# Patient Record
Sex: Male | Born: 1980 | Hispanic: Yes | Marital: Married | Smoking: Never smoker
Health system: Southern US, Community
[De-identification: ages and names within clinical notes are randomized; demographics above are authoritative.]

## PROBLEM LIST (undated history)

## (undated) DIAGNOSIS — G43909 Migraine, unspecified, not intractable, without status migrainosus: Secondary | ICD-10-CM

## (undated) DIAGNOSIS — K449 Diaphragmatic hernia without obstruction or gangrene: Secondary | ICD-10-CM

## (undated) DIAGNOSIS — B019 Varicella without complication: Secondary | ICD-10-CM

## (undated) DIAGNOSIS — K200 Eosinophilic esophagitis: Secondary | ICD-10-CM

## (undated) DIAGNOSIS — Z973 Presence of spectacles and contact lenses: Secondary | ICD-10-CM

## (undated) DIAGNOSIS — K810 Acute cholecystitis: Secondary | ICD-10-CM

## (undated) HISTORY — DX: Acute cholecystitis: K81.0

## (undated) HISTORY — PX: INGUINAL HERNIA REPAIR: SUR1180

## (undated) HISTORY — DX: Migraine, unspecified, not intractable, without status migrainosus: G43.909

## (undated) HISTORY — DX: Varicella without complication: B01.9

---

## 2003-08-08 DIAGNOSIS — S3600XA Unspecified injury of spleen, initial encounter: Secondary | ICD-10-CM

## 2003-08-08 DIAGNOSIS — Z87828 Personal history of other (healed) physical injury and trauma: Secondary | ICD-10-CM

## 2003-08-08 HISTORY — DX: Unspecified injury of spleen, initial encounter: S36.00XA

## 2003-08-08 HISTORY — DX: Personal history of other (healed) physical injury and trauma: Z87.828

## 2019-06-08 DIAGNOSIS — K8020 Calculus of gallbladder without cholecystitis without obstruction: Secondary | ICD-10-CM

## 2019-06-08 HISTORY — DX: Calculus of gallbladder without cholecystitis without obstruction: K80.20

## 2019-06-21 HISTORY — PX: LAPAROSCOPIC CHOLECYSTECTOMY: SUR755

## 2019-06-21 HISTORY — PX: CHOLECYSTECTOMY: SHX55

## 2019-06-21 LAB — BASIC METABOLIC PANEL
BUN: 14 (ref 4–21)
Creatinine: 1 (ref 0.6–1.3)
Glucose: 119

## 2019-06-21 LAB — COMPREHENSIVE METABOLIC PANEL
Albumin: 4.6 (ref 3.5–5.0)
Calcium: 9.5 (ref 8.7–10.7)

## 2019-06-21 LAB — NOVEL CORONAVIRUS, NAA: SARS-CoV-2, NAA: NEGATIVE

## 2019-06-21 LAB — CBC AND DIFFERENTIAL
HCT: 46 (ref 41–53)
Hemoglobin: 14.5 (ref 13.5–17.5)
Platelets: 296 (ref 150–399)
WBC: 12.8

## 2019-06-21 LAB — HEPATIC FUNCTION PANEL
Alkaline Phosphatase: 69 (ref 25–125)
Bilirubin, Total: 0.4

## 2019-06-21 LAB — CBC: RBC: 5.67 — AB (ref 3.87–5.11)

## 2019-06-26 ENCOUNTER — Encounter: Payer: Self-pay | Admitting: Family Medicine

## 2019-07-01 ENCOUNTER — Other Ambulatory Visit: Payer: Self-pay

## 2019-07-01 ENCOUNTER — Encounter: Payer: Self-pay | Admitting: Family Medicine

## 2019-07-01 ENCOUNTER — Ambulatory Visit: Payer: BC Managed Care – PPO | Admitting: Family Medicine

## 2019-07-01 VITALS — BP 140/85 | HR 78 | Temp 97.8°F | Resp 18 | Ht 68.0 in | Wt 202.0 lb

## 2019-07-01 DIAGNOSIS — K8020 Calculus of gallbladder without cholecystitis without obstruction: Secondary | ICD-10-CM | POA: Diagnosis not present

## 2019-07-01 DIAGNOSIS — E669 Obesity, unspecified: Secondary | ICD-10-CM | POA: Insufficient documentation

## 2019-07-01 DIAGNOSIS — Z9049 Acquired absence of other specified parts of digestive tract: Secondary | ICD-10-CM | POA: Diagnosis not present

## 2019-07-01 DIAGNOSIS — R030 Elevated blood-pressure reading, without diagnosis of hypertension: Secondary | ICD-10-CM | POA: Diagnosis not present

## 2019-07-01 DIAGNOSIS — Z0000 Encounter for general adult medical examination without abnormal findings: Secondary | ICD-10-CM

## 2019-07-01 NOTE — Progress Notes (Signed)
Patient ID: Adam Dean, male  DOB: 1980/12/08, 38 y.o.   MRN: [id number] Patient Care Team    Relationship Specialty Notifications Start End  Ma Hillock, DO PCP - General Family Medicine  06/26/19     Chief Complaint  Patient presents with  . Establish Care    Pt had gall bladder removed two weeks ago when in [place]. He has not had a F/U appt for this. No prior PCP.     Subjective:  Adam Dean is a 38 y.o.  male present for new patient establishment. All past medical history, surgical history, allergies, family history, immunizations, medications and social history were updated in the electronic medical record today. All recent labs, ED visits and hospitalizations within the last year were reviewed.  S/p cholecystectomy: Pt reports he was in [place] for a funeral and started experiencing severe abdominal pain 06/21/2019.  He went to the emergency room was found to have a large gallstone and went under emergent surgery for cholecystectomy.  He endorses having similar pain in October that lasted about 5 to 6 hours.  He reports he is recuperating well since surgery.  He is eating and drinking well without difficulty.  He used provided pain medication for about 3 days, and has not needed it since.  He is having normal bowel movements.  No problems urinating.  He denies fever, chills, nausea, vomit, diarrhea, abdominal pain, cough or leg pain since surgery.  He did drive home from [place] after surgery.  Depression screen [facility] 2/9 07/01/2019  Decreased Interest 0  Down, Depressed, Hopeless 0  PHQ - 2 Score 0   No flowsheet data found.     No flowsheet data found.  Immunization History  Administered Date(s) Administered  . IPV 11/18/1981, 02/03/1982, 04/07/1982  . MMR 01/12/1983, 06/16/1994  . Td 04/13/1993, 08/21/2012  . Tdap 08/21/2012    No exam data present  Past Medical History:  Diagnosis Date  . Acute cholecystitis   . Chicken pox   . Gallstones 06/2019   Emergent cholecystectomy in [place].  . Migraine    Allergies  Allergen Reactions  . Banana Diarrhea and Nausea And Vomiting   Past Surgical History:  Procedure Laterality Date  . CHOLECYSTECTOMY  06/21/2019   Family History  Problem Relation Age of Onset  . Hypertension Mother   . Birth defects Sister   . Heart attack Maternal Grandmother   . Heart attack Maternal Grandfather    Social History   Social History Narrative   Marital status/children/pets: Married.   Education/employment: Bachelor's degree.   Safety:      -smoke alarm in the home:Yes     - wears seatbelt: Yes     - Feels safe in their relationships: Yes    Allergies as of 07/01/2019      Reactions   Banana Diarrhea, Nausea And Vomiting      Medication List    as of July 01, 2019 12:13 PM   You have not been prescribed any medications.     All past medical history, surgical history, allergies, family history, immunizations andmedications were updated in the EMR today and reviewed under the history and medication portions of their EMR.    Recent Results (from the past 2160 hour(s))  CBC and differential     Status: None   Collection Time: 06/21/19 12:00 AM  Result Value Ref Range   Hemoglobin 14.5 13.5 - 17.5   HCT 46 41 - 53  Platelets 296 150 - 399   WBC 12.8   CBC     Status: Abnormal   Collection Time: 06/21/19 12:00 AM  Result Value Ref Range   RBC 5.67 (A) 3.87 - 5.11  Novel Coronavirus, NAA ([facility])     Status: None   Collection Time: 06/21/19 12:00 AM   Specimen: Nasopharyngeal(NP) swabs in vial transport medium  Result Value Ref Range   SARS-CoV-2, NAA Negative   Basic metabolic panel     Status: None   Collection Time: 06/21/19 12:00 AM  Result Value Ref Range   Glucose 119    BUN 14 4 - 21   Creatinine 1.0 0.6 - 1.3  Comprehensive metabolic panel     Status: None   Collection Time: 06/21/19 12:00 AM  Result Value Ref Range   Calcium 9.5 8.7 - 10.7   Albumin 4.6 3.5 -  5.0  Hepatic function panel     Status: None   Collection Time: 06/21/19 12:00 AM  Result Value Ref Range   Alkaline Phosphatase 69 25 - 125   Bilirubin, Total 0.4     Patient was never admitted.   ROS: 14 pt review of systems performed and negative (unless mentioned in an HPI)  Objective: BP 140/85 (BP Location: Left Arm, Patient Position: Sitting, Cuff Size: Normal)   Pulse 78   Temp 97.8 F (36.6 C) (Temporal)   Resp 18   Ht _0  (1.727 m)   Wt 202 lb (91.6 kg)   SpO2 98%   BMI 30.71 kg/m  Gen: Afebrile. No acute distress. Nontoxic in appearance, well-developed, well-nourished, pleasant male. HENT: AT. [place].  No cough on exam, no hoarseness on exam. Eyes:Pupils Equal Round Reactive to light, Extraocular movements intact,  Conjunctiva without redness, discharge or icterus. CV: RRR no murmur, no edema Chest: CTAB, no wheeze, rhonchi or crackles.  Abd: Soft.  Flat.  Well-healing and approximated port sites, epigastric incision and umbilicus incision-no erythema, no drainage.  NTND. BS present.  No masses palpated. No hepatosplenomegaly. No rebound tenderness or guarding. Skin: No rashes, purpura or petechiae. Warm and well-perfused. Skin intact. Neuro/Msk: Normal gait. PERLA. EOMi. Alert. Oriented x3.  Negative Homans bilaterally. Psych: Normal affect, dress and demeanor. Normal speech. Normal thought content and judgment.   Assessment/plan: Adam Dean is a 38 y.o. male present for est care Status post cholecystectomy/Gallstones VSS.  Patient appears well today.  Tolerating p.o. and no problems with bowel movements or voiding. Incision sites are healing well with good approximation. Encouraged him to follow-up if he develops a fever, cough, leg pain, shortness of breath or abdominal pain.  Elevated blood pressure without diagnosis of hypertension: Reviewed EMR and blood pressures routinely have been normal.  Advised him to monitor his blood pressures when able.  We will  schedule him for his physical in 2-3 months and recheck at that time.  Return in about 3 months (around 10/01/2019) for CPE (30 min).  This visit occurred during the SARS-CoV-2 public health emergency.  Safety protocols were in place, including screening questions prior to the visit, additional usage of staff PPE, and extensive cleaning of exam room while observing appropriate contact time as indicated for disinfecting solutions.   Greater than 30 minutes spent with patient, >50% of time spent face to face    Note is dictated utilizing voice recognition software. Although note has been proof read prior to signing, occasional typographical errors still can be missed. If any questions arise, please do not hesitate  to call for verification.  Electronically signed by: Howard Pouch, DO [place]

## 2019-07-01 NOTE — Patient Instructions (Addendum)
Nice to meet you today.  You look like you are healing well.  We will call you to schedule a physical in 2-3 months. Try to be fasting for 6 hours- water only.      Please help [place] help you:  We are honored you have chosen [facility] for your Primary Care home. Below you will find basic instructions that you may need to access in the future. Please help [place] help you by reading the instructions, which cover many of the frequent questions we experience.   Prescription refills and request:  -In order to allow more efficient response time, please call your pharmacy for all refills. They will forward the request electronically to [place]. This allows for the quickest possible response. Request left on a nurse line can take longer to refill, since these are checked as time allows between office patients and other phone calls.  - refill request can take up to 3-5 working days to complete.  - If request is sent electronically and request is appropiate, it is usually completed in 1-2 business days.  - all patients will need to be seen routinely for all chronic medical conditions requiring prescription medications (see follow-up below). If you are overdue for follow up on your condition, you will be asked to make an appointment and we will call in enough medication to cover you until your appointment (up to 30 days).  - all controlled substances will require a face to face visit to request/refill.  - if you desire your prescriptions to go through a new pharmacy, and have an active script at original pharmacy, you will need to call your pharmacy and have scripts transferred to new pharmacy. This is completed between the pharmacy locations and not by your provider.    Results: If any images or labs were ordered, it can take up to 1 week to get results depending on the test ordered and the lab/facility running and resulting the test. - Normal or stable results, which do not need further discussion, may be  released to your mychart immediately with attached note to you. A call may not be generated for normal results. Please make certain to sign up for mychart. If you have questions on how to activate your mychart you can call the front office.  - If your results need further discussion, our office will attempt to contact you via phone, and if unable to reach you after 2 attempts, we will release your abnormal result to your mychart with instructions.  - All results will be automatically released in mychart after 1 week.  - Your provider will provide you with explanation and instruction on all relevant material in your results. Please keep in mind, results and labs may appear confusing or abnormal to the untrained eye, but it does not mean they are actually abnormal for you personally. If you have any questions about your results that are not covered, or you desire more detailed explanation than what was provided, you should make an appointment with your provider to do so.   Our office handles many outgoing and incoming calls daily. If we have not contacted you within 1 week about your results, please check your mychart to see if there is a message first and if not, then contact our office.  In helping with this matter, you help decrease call volume, and therefore allow [place] to be able to respond to patients needs more efficiently.   Acute office visits (sick visit):  An acute  visit is intended for a new problem and are scheduled in shorter time slots to allow schedule openings for patients with new problems. This is the appropriate visit to discuss a new problem. Problems will not be addressed by phone call or Echart message. Appointment is needed if requesting treatment. In order to provide you with excellent quality medical care with proper time for you to explain your problem, have an exam and receive treatment with instructions, these appointments should be limited to one new problem per visit. If you  experience a new problem, in which you desire to be addressed, please make an acute office visit, we save openings on the schedule to accommodate you. Please do not save your new problem for any other type of visit, let us take care of it properly and quickly for you.   Follow up visits:  Depending on your condition(s) your provider will need to see you routinely in order to provide you with quality care and prescribe medication(s). Most chronic conditions (Example: hypertension, Diabetes, depression/anxiety... etc), require visits a couple times a year. Your provider will instruct you on proper follow up for your personal medical conditions and history. Please make certain to make follow up appointments for your condition as instructed. Failing to do so could result in lapse in your medication treatment/refills. If you request a refill, and are overdue to be seen on a condition, we will always provide you with a 30 day script (once) to allow you time to schedule.    Medicare wellness (well visit): - we have a wonderful Nurse Maudie Mercury), that will meet with you and provide you will yearly medicare wellness visits. These visits should occur yearly (can not be scheduled less than 1 calendar year apart) and cover preventive health, immunizations, advance directives and screenings you are entitled to yearly through your medicare benefits. Do not miss out on your entitled benefits, this is when medicare will pay for these benefits to be ordered for you.  These are strongly encouraged by your provider and is the appropriate type of visit to make certain you are up to date with all preventive health benefits. If you have not had your medicare wellness exam in the last 12 months, please make certain to schedule one by calling the office and schedule your medicare wellness with Maudie Mercury as soon as possible.   Yearly physical (well visit):  - Adults are recommended to be seen yearly for physicals. Check with your insurance  and date of your last physical, most insurances require one calendar year between physicals. Physicals include all preventive health topics, screenings, medical exam and labs that are appropriate for gender/age and history. You may have fasting labs needed at this visit. This is a well visit (not a sick visit), new problems should not be covered during this visit (see acute visit).  - Pediatric patients are seen more frequently when they are younger. Your provider will advise you on well child visit timing that is appropriate for your their age. - This is not a medicare wellness visit. Medicare wellness exams do not have an exam portion to the visit. Some medicare companies allow for a physical, some do not allow a yearly physical. If your medicare allows a yearly physical you can schedule the medicare wellness with our nurse Maudie Mercury and have your physical with your provider after, on the same day. Please check with insurance for your full benefits.   Late Policy/No Shows:  - all new patients should arrive 15-30 minutes  earlier than appointment to allow [place] time  to  obtain all personal demographics,  insurance information and for you to complete office paperwork. - All established patients should arrive 10-15 minutes earlier than appointment time to update all information and be checked in .  - In our best efforts to run on time, if you are late for your appointment you will be asked to either reschedule or if able, we will work you back into the schedule. There will be a wait time to work you back in the schedule,  depending on availability.  - If you are unable to make it to your appointment as scheduled, please call 24 hours ahead of time to allow [place] to fill the time slot with someone else who needs to be seen. If you do not cancel your appointment ahead of time, you may be charged a no show fee.

## 2020-05-05 ENCOUNTER — Encounter: Payer: Self-pay | Admitting: Family Medicine

## 2020-05-05 ENCOUNTER — Other Ambulatory Visit: Payer: Self-pay

## 2020-05-05 ENCOUNTER — Ambulatory Visit (INDEPENDENT_AMBULATORY_CARE_PROVIDER_SITE_OTHER): Payer: BC Managed Care – PPO | Admitting: Family Medicine

## 2020-05-05 VITALS — BP 110/74 | HR 76 | Temp 99.1°F | Ht 68.0 in | Wt 203.0 lb

## 2020-05-05 DIAGNOSIS — R1032 Left lower quadrant pain: Secondary | ICD-10-CM | POA: Diagnosis not present

## 2020-05-05 LAB — CBC WITH DIFFERENTIAL/PLATELET
Basophils Absolute: 0.1 10*3/uL (ref 0.0–0.1)
Basophils Relative: 1.2 % (ref 0.0–3.0)
Eosinophils Absolute: 0.3 10*3/uL (ref 0.0–0.7)
Eosinophils Relative: 5.5 % — ABNORMAL HIGH (ref 0.0–5.0)
HCT: 44.1 % (ref 39.0–52.0)
Hemoglobin: 14.5 g/dL (ref 13.0–17.0)
Lymphocytes Relative: 32.7 % (ref 12.0–46.0)
Lymphs Abs: 1.7 10*3/uL (ref 0.7–4.0)
MCHC: 32.9 g/dL (ref 30.0–36.0)
MCV: 79.6 fl (ref 78.0–100.0)
Monocytes Absolute: 0.4 10*3/uL (ref 0.1–1.0)
Monocytes Relative: 7.2 % (ref 3.0–12.0)
Neutro Abs: 2.9 10*3/uL (ref 1.4–7.7)
Neutrophils Relative %: 53.4 % (ref 43.0–77.0)
Platelets: 273 10*3/uL (ref 150.0–400.0)
RBC: 5.54 Mil/uL (ref 4.22–5.81)
RDW: 14.2 % (ref 11.5–15.5)
WBC: 5.4 10*3/uL (ref 4.0–10.5)

## 2020-05-05 LAB — C-REACTIVE PROTEIN: CRP: <1 mg/dL (ref 0.5–20.0)

## 2020-05-05 NOTE — Progress Notes (Signed)
This visit occurred during the SARS-CoV-2 public health emergency.  Safety protocols were in place, including screening questions prior to the visit, additional usage of staff PPE, and extensive cleaning of exam room while observing appropriate contact time as indicated for disinfecting solutions.    Adam Dean , [birth year], 39 y.o., male MRN: [id number] Patient Care Team    Relationship Specialty Notifications Start End  Adam Hillock, DO PCP - General Family Medicine  06/26/19     Chief Complaint  Patient presents with  . Abdominal Pain    pt c/o LLQ pain x 1 year after cholecystectomy worsen in the last month. Pt describes pain as a constant bloating that is trigger in different positions      Subjective: Pt presents for an OV with complaints of left lower quadrant discomfort that he noticed started last year after his cholecystectomy.  Over the last month symptoms have become worse and constant.  He does endorse an occasional sharp pain but pain is mostly a constant "pressure "or "bloating "feeling.  He does endorse having more frequent stools since his cholecystectomy.  He has a bowel movement 3-4 times daily.  He states he produces less stool with each bowel movement, but has more bowel movements in a day.  He denies change in consistency or color of bowel movement.  He denies any blood per rectum.  He denies any dysuria or bladder pressure.  He denies injury or history of hernia.  He points to his left flank area as the area of discomfort.  He denies fever, chills, nausea, vomit.  He is a non-smoker.  There is no personal or family history of Crohn's or ulcerative colitis.  There is no personal or family history of colon cancer, GI cancer, pancreatic cancer or kidney cancer.  Depression screen [facility] 2/9 05/05/2020 07/01/2019  Decreased Interest 0 0  Down, Depressed, Hopeless 0 0  PHQ - 2 Score 0 0    Allergies  Allergen Reactions  . Banana Diarrhea and Nausea And Vomiting    Social History   Social History Narrative   Marital status/children/pets: Married.   Education/employment: Bachelor's degree.   Safety:      -smoke alarm in the home:Yes     - wears seatbelt: Yes     - Feels safe in their relationships: Yes   Past Medical History:  Diagnosis Date  . Acute cholecystitis   . Chicken pox   . Gallstones 06/2019   Emergent cholecystectomy in [place].  . Migraine    Past Surgical History:  Procedure Laterality Date  . CHOLECYSTECTOMY  06/21/2019   Family History  Problem Relation Age of Onset  . Hypertension Mother   . Birth defects Sister   . Heart attack Maternal Grandmother   . Heart attack Maternal Grandfather    Allergies as of 05/05/2020      Reactions   Banana Diarrhea, Nausea And Vomiting      Medication List    as of May 05, 2020  8:53 AM   You have not been prescribed any medications.     All past medical history, surgical history, allergies, family history, immunizations andmedications were updated in the EMR today and reviewed under the history and medication portions of their EMR.     ROS: Negative, with the exception of above mentioned in HPI   Objective:  BP 110/74   Pulse 76   Temp 99.1 F (37.3 C) (Oral)   Ht 5\' 8"  (1.727  m)   Wt 203 lb (92.1 kg)   SpO2 99%   BMI 30.87 kg/m  Body mass index is 30.87 kg/m. Gen: Afebrile. No acute distress. Nontoxic in appearance, well developed, well nourished.  HENT: AT. [place].  Eyes:Pupils Equal Round Reactive to light, Extraocular movements intact,  Conjunctiva without redness, discharge or icterus. CV: RRR, no edema Chest: CTAB, no wheeze or crackles.  Abd: Soft..Flat. NTND. BS present.  No masses palpated. No rebound or guarding.  MSK: No CVA tenderness bilaterally Skin: No rashes, purpura or petechiae.  Neuro:  Normal gait. PERLA. EOMi. Alert. Oriented x3 Psych: Normal affect, dress and demeanor. Normal speech. Normal thought content and judgment.  No exam  data present No results found. No results found for this or any previous visit (from the past 24 hour(s)).  Assessment/Plan: Adam Dean is a 39 y.o. male present for OV for  LLQ pain Patient approximately 10 months status post laparoscopic cholecystectomy for gallstones, with onset of left lower quadrant discomfort that has slowly progressed over the last 10 months since surgery.  Uncertain etiology of his discomfort or its correlation, if any with his laparoscopic cholecystectomy.  Was unable to reproduce discomfort on exam today.  No masses palpated.  He is having more frequent stools since cholecystectomy.  Patient was reassured this can be common after a cholecystectomy.  Not having diarrhea or unintentional weight loss.  Will obtain CBC and CRP today. -Patient was encouraged to start fiber supplement, hydrate and start probiotic OTC. - CBC w/Diff - C-reactive protein - Consider CMP, lipase and imaging if laboratory results collected today are normal and patient does not see improvement with recommended therapy. Follow-up in 3-4 weeks if no improvement in symptoms with recommendations, or sooner if worsening.   Reviewed expectations re: course of current medical issues.  Discussed self-management of symptoms.  Outlined signs and symptoms indicating need for more acute intervention.  Patient verbalized understanding and all questions were answered.  Patient received an After-Visit Summary.    No orders of the defined types were placed in this encounter.  No orders of the defined types were placed in this encounter.  Referral Orders  No referral(s) requested today     Note is dictated utilizing voice recognition software. Although note has been proof read prior to signing, occasional typographical errors still can be missed. If any questions arise, please do not hesitate to call for verification.   electronically signed by:  Howard Pouch, DO  [place]

## 2020-05-05 NOTE — Patient Instructions (Signed)
Start over the counter probiotic daily.  Increase fiber in the diet.  Hydrate.  We will call you with lab results.     High-Fiber Diet Fiber, also called dietary fiber, is a type of carbohydrate that is found in fruits, vegetables, whole grains, and beans. A high-fiber diet can have many health benefits. Your health care provider may recommend a high-fiber diet to help:  Prevent constipation. Fiber can make your bowel movements more regular.  Lower your cholesterol.  Relieve the following conditions: ? Swelling of veins in the anus (hemorrhoids). ? Swelling and irritation (inflammation) of specific areas of the digestive tract (uncomplicated diverticulosis). ? A problem of the large intestine (colon) that sometimes causes pain and diarrhea (irritable bowel syndrome, IBS).  Prevent overeating as part of a weight-loss plan.  Prevent heart disease, type 2 diabetes, and certain cancers. What is my plan? The recommended daily fiber intake in grams (g) includes:  38 g for men age 68 or younger.  30 g for men over age 26.  74 g for women age 72 or younger.  21 g for women over age 65. You can get the recommended daily intake of dietary fiber by:  Eating a variety of fruits, vegetables, grains, and beans.  Taking a fiber supplement, if it is not possible to get enough fiber through your diet. What do I need to know about a high-fiber diet?  It is better to get fiber through food sources rather than from fiber supplements. There is not a lot of research about how effective supplements are.  Always check the fiber content on the nutrition facts label of any prepackaged food. Look for foods that contain 5 g of fiber or more per serving.  Talk with a diet and nutrition specialist (dietitian) if you have questions about specific foods that are recommended or not recommended for your medical condition, especially if those foods are not listed below.  Gradually increase how much fiber  you consume. If you increase your intake of dietary fiber too quickly, you may have bloating, cramping, or gas.  Drink plenty of water. Water helps you to digest fiber. What are tips for following this plan?  Eat a wide variety of high-fiber foods.  Make sure that half of the grains that you eat each day are whole grains.  Eat breads and cereals that are made with whole-grain flour instead of refined flour or white flour.  Eat brown rice, bulgur wheat, or millet instead of white rice.  Start the day with a breakfast that is high in fiber, such as a cereal that contains 5 g of fiber or more per serving.  Use beans in place of meat in soups, salads, and pasta dishes.  Eat high-fiber snacks, such as berries, raw vegetables, nuts, and popcorn.  Choose whole fruits and vegetables instead of processed forms like juice or sauce. What foods can I eat?  Fruits Berries. Pears. Apples. Oranges. Avocado. Prunes and raisins. Dried figs. Vegetables Sweet potatoes. Spinach. Kale. Artichokes. Cabbage. Broccoli. Cauliflower. Green peas. Carrots. Squash. Grains Whole-grain breads. Multigrain cereal. Oats and oatmeal. Brown rice. Barley. Bulgur wheat. [place]. Quinoa. Bran muffins. Popcorn. Rye wafer crackers. Meats and other proteins Navy, kidney, and pinto beans. Soybeans. Split peas. Lentils. Nuts and seeds. Dairy Fiber-fortified yogurt. Beverages Fiber-fortified soy milk. Fiber-fortified orange juice. Other foods Fiber bars. The items listed above may not be a complete list of recommended foods and beverages. Contact a dietitian for more options. What foods are not recommended?  Fruits Fruit juice. Cooked, strained fruit. Vegetables Fried potatoes. Canned vegetables. Well-cooked vegetables. Grains White bread. Pasta made with refined flour. White rice. Meats and other proteins Fatty cuts of meat. Fried chicken or fried fish. Dairy Milk. Yogurt. Cream cheese. Sour cream. Fats and  oils Butters. Beverages Soft drinks. Other foods Cakes and pastries. The items listed above may not be a complete list of foods and beverages to avoid. Contact a dietitian for more information. Summary  Fiber is a type of carbohydrate. It is found in fruits, vegetables, whole grains, and beans.  There are many health benefits of eating a high-fiber diet, such as preventing constipation, lowering blood cholesterol, helping with weight loss, and reducing your risk of heart disease, diabetes, and certain cancers.  Gradually increase your intake of fiber. Increasing too fast can result in cramping, bloating, and gas. Drink plenty of water while you increase your fiber.  The best sources of fiber include whole fruits and vegetables, whole grains, nuts, seeds, and beans. This information is not intended to replace advice given to you by your health care provider. Make sure you discuss any questions you have with your health care provider. Document Revised: 05/28/2017 Document Reviewed: 05/28/2017 Elsevier Patient Education  2020 [employer].

## 2020-05-06 ENCOUNTER — Encounter: Payer: Self-pay | Admitting: Family Medicine

## 2020-07-13 ENCOUNTER — Other Ambulatory Visit: Payer: Self-pay

## 2020-07-13 ENCOUNTER — Ambulatory Visit: Payer: BC Managed Care – PPO | Admitting: Family Medicine

## 2020-07-13 ENCOUNTER — Encounter: Payer: Self-pay | Admitting: Family Medicine

## 2020-07-13 VITALS — BP 110/76 | HR 99 | Temp 98.3°F | Ht 68.0 in | Wt 203.0 lb

## 2020-07-13 DIAGNOSIS — R222 Localized swelling, mass and lump, trunk: Secondary | ICD-10-CM

## 2020-07-13 NOTE — Progress Notes (Signed)
This visit occurred during the SARS-CoV-2 public health emergency.  Safety protocols were in place, including screening questions prior to the visit, additional usage of staff PPE, and extensive cleaning of exam room while observing appropriate contact time as indicated for disinfecting solutions.    Adam Dean , April 29, 1981, 39 y.o., male MRN: [id number] Patient Care Team    Relationship Specialty Notifications Start End  Ma Hillock, DO PCP - General Family Medicine  06/26/19     Chief Complaint  Patient presents with  . Cyst    pt states there is a cyst under skin discover by wife 2 weeks ago; denies any pain     Subjective: Pt presents for an OV with concerns mass of his back that his wife noticed about 2 weeks ago.  He reports it is not red, swollen, tender, growing or draining. His wife has concerns- bc his MIL recently passed away from lung cancer.  Pt reports he has never had masses like this in the past.  He has no family history of sarcomas.  Depression screen [facility] 2/9 07/13/2020 05/05/2020 07/01/2019  Decreased Interest 0 0 0  Down, Depressed, Hopeless 0 0 0  PHQ - 2 Score 0 0 0    Allergies  Allergen Reactions  . Banana Diarrhea and Nausea And Vomiting   Social History   Social History Narrative   Marital status/children/pets: Married.   Education/employment: Bachelor's degree.   Safety:      -smoke alarm in the home:Yes     - wears seatbelt: Yes     - Feels safe in their relationships: Yes   Past Medical History:  Diagnosis Date  . Acute cholecystitis   . Chicken pox   . Gallstones 06/2019   Emergent cholecystectomy in [place].  . Migraine    Past Surgical History:  Procedure Laterality Date  . CHOLECYSTECTOMY  06/21/2019   Family History  Problem Relation Age of Onset  . Hypertension Mother   . Birth defects Sister   . Heart attack Maternal Grandmother   . Heart attack Maternal Grandfather    Allergies as of 07/13/2020      Reactions     Banana Diarrhea, Nausea And Vomiting      Medication List    as of July 13, 2020  2:54 PM   You have not been prescribed any medications.     All past medical history, surgical history, allergies, family history, immunizations andmedications were updated in the EMR today and reviewed under the history and medication portions of their EMR.     ROS: Negative, with the exception of above mentioned in HPI   Objective:  BP 110/76   Pulse 99   Temp 98.3 F (36.8 C) (Oral)   Ht 5\' 8"  (1.727 m)   Wt 203 lb (92.1 kg)   SpO2 97%   BMI 30.87 kg/m  Body mass index is 30.87 kg/m. Gen: Afebrile. No acute distress. Nontoxic in appearance, well developed, well nourished.  HENT: AT. [place]. Eyes:Pupils Equal Round Reactive to light, Extraocular movements intact,  Conjunctiva without redness, discharge or icterus. MSK: Rt thoracic back 1.5 inches inferior to scapula there is 2.5x 1.5 cm mobile nontender subq mass.  Neuro: Normal gait. PERLA. EOMi. Alert. Oriented x3  Psych: Normal affect, dress and demeanor. Normal speech. Normal thought content and judgment.  No exam data present No results found. No results found for this or any previous visit (from the past 24 hour(s)).  Assessment/Plan:  Adam Dean is a 39 y.o. male present for OV for  Mass of subcutaneous tissue of back Likely lipoma-attempted to reassure pt. Mass Does not appear consistent with epidermal cyst.  Pt has concerns of cancer and would like evaluation by [place]. Can not rule out cancerous mass> [place] ordered today.     Reviewed expectations re: course of current medical issues.  Discussed self-management of symptoms.  Outlined signs and symptoms indicating need for more acute intervention.  Patient verbalized understanding and all questions were answered.  Patient received an After-Visit Summary.    Orders Placed This Encounter  Procedures  . [place] MiscellaneoUS Localization   No orders of the defined types were  placed in this encounter.  Referral Orders  No referral(s) requested today     Note is dictated utilizing voice recognition software. Although note has been proof read prior to signing, occasional typographical errors still can be missed. If any questions arise, please do not hesitate to call for verification.   electronically signed by:  Howard Pouch, DO  [place]

## 2020-07-13 NOTE — Patient Instructions (Signed)
Lipoma  A lipoma is a noncancerous (benign) tumor that is made up of fat cells. This is a very common type of soft-tissue growth. Lipomas are usually found under the skin (subcutaneous). They may occur in any tissue of the body that contains fat. Common areas for lipomas to appear include the back, arms, shoulders, buttocks, and thighs. Lipomas grow slowly, and they are usually painless. Most lipomas do not cause problems and do not require treatment. What are the causes? The cause of this condition is not known. What increases the risk? You are more likely to develop this condition if:  You are 40-60 years old.  You have a family history of lipomas. What are the signs or symptoms? A lipoma usually appears as a small, round bump under the skin. In most cases, the lump will:  Feel soft or rubbery.  Not cause pain or other symptoms. However, if a lipoma is located in an area where it pushes on nerves, it can become painful or cause other symptoms. How is this diagnosed? A lipoma can usually be diagnosed with a physical exam. You may also have tests to confirm the diagnosis and to rule out other conditions. Tests may include:  Imaging tests, such as a CT scan or an MRI.  Removal of a tissue sample to be looked at under a microscope (biopsy). How is this treated? Treatment for this condition depends on the size of the lipoma and whether it is causing any symptoms.  For small lipomas that are not causing problems, no treatment is needed.  If a lipoma is bigger or it causes problems, surgery may be done to remove the lipoma. Lipomas can also be removed to improve appearance. Most often, the procedure is done after applying a medicine that numbs the area (local anesthetic).  Liposuction may be done to reduce the size of the lipoma before it is removed through surgery, or it may be done to remove the lipoma. Lipomas are removed with this method in order to limit incision size and scarring. A  liposuction tube is inserted through a small incision into the lipoma, and the contents of the lipoma are removed through the tube with suction. Follow these instructions at home:  Watch your lipoma for any changes.  Keep all follow-up visits as told by your health care provider. This is important. Contact a health care provider if:  Your lipoma becomes larger or hard.  Your lipoma becomes painful, red, or increasingly swollen. These could be signs of infection or a more serious condition. Get help right away if:  You develop tingling or numbness in an area near the lipoma. This could indicate that the lipoma is causing nerve damage. Summary  A lipoma is a noncancerous tumor that is made up of fat cells.  Most lipomas do not cause problems and do not require treatment.  If a lipoma is bigger or it causes problems, surgery may be done to remove the lipoma.  Contact a health care provider if your lipoma becomes larger or hard, or if it becomes painful, red, or increasingly swollen. Pain, redness, and swelling could be signs of infection or a more serious condition. This information is not intended to replace advice given to you by your health care provider. Make sure you discuss any questions you have with your health care provider. Document Revised: 03/10/2019 Document Reviewed: 03/10/2019 Elsevier Patient Education  2020 Elsevier Inc.  

## 2020-07-15 NOTE — Addendum Note (Signed)
Addended by: Kavin Leech on: 07/15/2020 04:45 PM   Modules accepted: Orders

## 2020-07-20 ENCOUNTER — Telehealth: Payer: Self-pay

## 2020-07-20 NOTE — Telephone Encounter (Signed)
He was not seen for an issue with his kidneys. He has a back mass that is being imaged. No additional images can be added on.  If he is concerned about his kidneys, then we would need to see him for whatever those SYMPTOMS are that he is experiencing that has him concerned for his kidneys.   Thanks.

## 2020-07-20 NOTE — Telephone Encounter (Signed)
Patient called wanting to know if additional imaging could be done of his kidneys since he is already scheduled for 12/21 at [place]. Last seen for this complaint 9/29.  Please advise, thanks.

## 2020-07-20 NOTE — Telephone Encounter (Signed)
Please advise 

## 2020-07-20 NOTE — Telephone Encounter (Signed)
Pt sched for early Jan for LLQ pain for 04/2020

## 2020-07-27 ENCOUNTER — Ambulatory Visit
Admission: RE | Admit: 2020-07-27 | Discharge: 2020-07-27 | Disposition: A | Discharge status: Home / Self Care | Payer: BC Managed Care – PPO | Source: Ambulatory Visit | Attending: Family Medicine | Admitting: Family Medicine

## 2020-07-27 DIAGNOSIS — R222 Localized swelling, mass and lump, trunk: Secondary | ICD-10-CM

## 2020-08-07 DIAGNOSIS — K2970 Gastritis, unspecified, without bleeding: Secondary | ICD-10-CM

## 2020-08-07 DIAGNOSIS — D369 Benign neoplasm, unspecified site: Secondary | ICD-10-CM

## 2020-08-07 HISTORY — DX: Benign neoplasm, unspecified site: D36.9

## 2020-08-07 HISTORY — DX: Gastritis, unspecified, without bleeding: K29.70

## 2020-08-09 ENCOUNTER — Ambulatory Visit: Payer: BC Managed Care – PPO | Admitting: Family Medicine

## 2020-08-09 ENCOUNTER — Other Ambulatory Visit: Payer: Self-pay

## 2020-08-09 ENCOUNTER — Encounter: Payer: Self-pay | Admitting: Family Medicine

## 2020-08-09 VITALS — BP 138/89 | HR 81 | Temp 97.9°F | Resp 18 | Wt 204.1 lb

## 2020-08-09 DIAGNOSIS — R194 Change in bowel habit: Secondary | ICD-10-CM | POA: Diagnosis not present

## 2020-08-09 DIAGNOSIS — Z9889 Other specified postprocedural states: Secondary | ICD-10-CM | POA: Diagnosis not present

## 2020-08-09 DIAGNOSIS — R1032 Left lower quadrant pain: Secondary | ICD-10-CM | POA: Diagnosis not present

## 2020-08-09 DIAGNOSIS — Z8719 Personal history of other diseases of the digestive system: Secondary | ICD-10-CM | POA: Diagnosis not present

## 2020-08-09 NOTE — Progress Notes (Addendum)
This visit occurred during the SARS-CoV-2 public health emergency.  Safety protocols were in place, including screening questions prior to the visit, additional usage of staff PPE, and extensive cleaning of exam room while observing appropriate contact time as indicated for disinfecting solutions.    Adam Dean , 1981-07-10, 40 y.o., male MRN: [id number] Patient Care Team    Relationship Specialty Notifications Start End  Adam Hillock, DO PCP - General Family Medicine  06/26/19     Chief Complaint  Patient presents with  . Abdominal Pain    Constant pressure.     Subjective: Pt presents for an OV with complaints of left lower quadrant discomfort. He was evaluated for this issue in September 2021 (see below for details). CBC and crp at that time were normal. He reports constant daily discomfort in his LLQ that has been present > 1 yr. He has increased his fiber and hydration and symptoms have remained the same. He states the discomfort is present everyday- sometimes less than others. He reports no constipation, stools are formed but soft. He denies color change to stools or BRBPR. He has 1-4 BM daily and reports he never feels like he has full emptying of his colon and his BMs are small amounts each time. He denies ejaculatory pain or discomfort.  He has no fhx of IBS, IBD or colon cancer.He reports when he was 62-47 years old he did have a "hernia" surgery he believes on his left scrotal/inguinal area.   Prior note: LLQ pain that he noticed started last year after his cholecystectomy.  Over the last month symptoms have become worse and constant.  He does endorse an occasional sharp pain but pain is mostly a constant "pressure "or "bloating "feeling.  He does endorse having more frequent stools since his cholecystectomy.  He has a bowel movement 3-4 times daily.  He states he produces less stool with each bowel movement, but has more bowel movements in a day.  He denies change in  consistency or color of bowel movement.  He denies any blood per rectum.  He denies any dysuria or bladder pressure.  He denies injury or history of hernia.  He points to his left flank area as the area of discomfort.  He denies fever, chills, nausea, vomit.  He is a non-smoker.  There is no personal or family history of Crohn's or ulcerative colitis.  There is no personal or family history of colon cancer, GI cancer, pancreatic cancer or kidney cancer.  Depression screen [facility] 2/9 07/13/2020 05/05/2020 07/01/2019  Decreased Interest 0 0 0  Down, Depressed, Hopeless 0 0 0  PHQ - 2 Score 0 0 0    Allergies  Allergen Reactions  . Banana Diarrhea and Nausea And Vomiting   Social History   Social History Narrative   Marital status/children/pets: Married.   Education/employment: Bachelor's degree.   Safety:      -smoke alarm in the home:Yes     - wears seatbelt: Yes     - Feels safe in their relationships: Yes   Past Medical History:  Diagnosis Date  . Acute cholecystitis   . Chicken pox   . Gallstones 06/2019   Emergent cholecystectomy in [place].  . Migraine    Past Surgical History:  Procedure Laterality Date  . CHOLECYSTECTOMY  06/21/2019  . INGUINAL HERNIA REPAIR     5-6 yrs old.    Family History  Problem Relation Age of Onset  . Hypertension Mother   .  Birth defects Sister   . Heart attack Maternal Grandmother   . Heart attack Maternal Grandfather    Allergies as of 08/09/2020      Reactions   Banana Diarrhea, Nausea And Vomiting      Medication List    as of August 09, 2020 10:49 AM   You have not been prescribed any medications.     All past medical history, surgical history, allergies, family history, immunizations andmedications were updated in the EMR today and reviewed under the history and medication portions of their EMR.     ROS: Negative, with the exception of above mentioned in HPI   Objective:  BP 138/89 (BP Location: Right Arm, Patient Position:  Sitting, Cuff Size: Normal)   Pulse 81   Temp 97.9 F (36.6 C) (Oral)   Resp 18   Wt 204 lb 2 oz (92.6 kg)   SpO2 96%   BMI 31.04 kg/m  Body mass index is 31.04 kg/m. Gen: Afebrile. No acute distress. Nontoxic. Pleasant male.  HENT: AT. [place].  Eyes:Pupils Equal Round Reactive to light, Extraocular movements intact,  Conjunctiva without redness, discharge or icterus. CV: RRR  Chest: CTAB, no wheeze or crackles Abd: Soft. Mild discomfort LLQ TTP. ND. BS present. no Masses palpated.  Skin: no rashes, purpura or petechiae.  Neuro: Normal gait. PERLA. EOMi. Alert. Orientedx3 Psych: Normal affect, dress and demeanor. Normal speech. Normal thought content and judgment..    No exam data present No results found. No results found for this or any previous visit (from the past 24 hour(s)).  Assessment/Plan: Adam Dean is a 40 y.o. male present for OV for  LLQ pain/bowel habit changes/s/p inguinal hernia repair - Uncertain etiology of his symptoms. With symptoms present over 1 yr> CT warranted for possible cause. With his reports of bowel fullness and incomplete emptying of bowel and h/o hernia as a child> would suspect either colon or hernia etiology, but can not rule out other causes.  DDX: hernia, diverticulosis, other colon etiology, bladder etiology.  -Patient was encouraged to start fiber supplement, hydrate and start probiotic OTC. - cmp -future - U/A future - CT with contrast ordered. Pt will be called with results and if appropriate referral made depending on results. If results essentially normal would refer to GI for consideration of early colonoscopy given symptoms > 1 yr.    Reviewed expectations re: course of current medical issues.  Discussed self-management of symptoms.  Outlined signs and symptoms indicating need for more acute intervention.  Patient verbalized understanding and all questions were answered.  Patient received an After-Visit Summary.    Orders  Placed This Encounter  Procedures  . CT Abdomen Pelvis W Contrast  . Comp Met (CMET)   No orders of the defined types were placed in this encounter.  Referral Orders  No referral(s) requested today     Note is dictated utilizing voice recognition software. Although note has been proof read prior to signing, occasional typographical errors still can be missed. If any questions arise, please do not hesitate to call for verification.   electronically signed by:  Howard Pouch, DO  [place]

## 2020-08-09 NOTE — Patient Instructions (Signed)

## 2020-08-10 ENCOUNTER — Other Ambulatory Visit: Payer: Self-pay

## 2020-08-10 ENCOUNTER — Telehealth: Payer: Self-pay | Admitting: Family Medicine

## 2020-08-10 DIAGNOSIS — R1032 Left lower quadrant pain: Secondary | ICD-10-CM

## 2020-08-10 DIAGNOSIS — Z8719 Personal history of other diseases of the digestive system: Secondary | ICD-10-CM

## 2020-08-10 DIAGNOSIS — R194 Change in bowel habit: Secondary | ICD-10-CM

## 2020-08-10 NOTE — Addendum Note (Signed)
Addended by: Felix Pacini A on: 08/10/2020 04:21 PM   Modules accepted: Orders

## 2020-08-10 NOTE — Telephone Encounter (Signed)
Reordered CT abd

## 2020-08-11 ENCOUNTER — Ambulatory Visit (INDEPENDENT_AMBULATORY_CARE_PROVIDER_SITE_OTHER): Payer: BC Managed Care – PPO

## 2020-08-11 DIAGNOSIS — R1032 Left lower quadrant pain: Secondary | ICD-10-CM

## 2020-08-11 LAB — COMPREHENSIVE METABOLIC PANEL
ALT: 40 U/L (ref 0–53)
AST: 19 U/L (ref 0–37)
Albumin: 4.8 g/dL (ref 3.5–5.2)
Alkaline Phosphatase: 65 U/L (ref 39–117)
BUN: 12 mg/dL (ref 6–23)
CO2: 31 mEq/L (ref 19–32)
Calcium: 9.6 mg/dL (ref 8.4–10.5)
Chloride: 103 mEq/L (ref 96–112)
Creatinine, Ser: 1.08 mg/dL (ref 0.40–1.50)
GFR: 86.83 mL/min (ref >60.00)
Glucose, Bld: 84 mg/dL (ref 70–99)
Potassium: 4 mEq/L (ref 3.5–5.1)
Sodium: 140 mEq/L (ref 135–145)
Total Bilirubin: 0.6 mg/dL (ref 0.2–1.2)
Total Protein: 8 g/dL (ref 6.0–8.3)

## 2020-08-12 LAB — URINALYSIS W MICROSCOPIC + REFLEX CULTURE
Bacteria, UA: NONE SEEN /HPF
Bilirubin Urine: NEGATIVE
Glucose, UA: NEGATIVE
Hgb urine dipstick: NEGATIVE
Hyaline Cast: NONE SEEN /LPF
Leukocyte Esterase: NEGATIVE
Nitrites, Initial: NEGATIVE
Protein, ur: NEGATIVE
RBC / HPF: NONE SEEN /HPF (ref 0–2)
Specific Gravity, Urine: 1.03 (ref 1.001–1.03)
Squamous Epithelial / HPF: NONE SEEN /HPF (ref <=5)
WBC, UA: NONE SEEN /HPF (ref 0–5)
pH: 5.5 (ref 5.0–8.0)

## 2020-08-12 LAB — NO CULTURE INDICATED

## 2020-08-19 ENCOUNTER — Encounter: Payer: Self-pay | Admitting: Podiatry

## 2020-08-19 ENCOUNTER — Ambulatory Visit: Payer: BC Managed Care – PPO | Admitting: Podiatry

## 2020-08-19 ENCOUNTER — Other Ambulatory Visit: Payer: Self-pay

## 2020-08-19 DIAGNOSIS — L309 Dermatitis, unspecified: Secondary | ICD-10-CM

## 2020-08-23 NOTE — Progress Notes (Signed)
Subjective:   Patient ID: Adam Dean, male   DOB: 40 y.o.   MRN: [id number]   HPI Patient is concerned because he has had possible fungus on both his feet and also has flaky sweaty dry skin and he is concerned about fungus   Review of Systems  All other systems reviewed and are negative.       Objective:  Physical Exam Vitals and nursing note reviewed.  Constitutional:      Appearance: He is well-developed and well-nourished.  Cardiovascular:     Pulses: Intact distal pulses.  Pulmonary:     Effort: Pulmonary effort is normal.  Musculoskeletal:        General: Normal range of motion.  Skin:    General: Skin is warm.  Neurological:     Mental Status: He is alert.     Neurovascular status intact muscle strength found to be adequate range of motion adequate with patient noted to have slight discoloration between his digits flaking skin and excessive sweating condition of a moderate nature     Assessment:  Possibility for dermatitis also possibility for hyperhidrosis creating irritation of tissue     Plan:  H&P reviewed different possibilities and I have recommended that he soak his feet utilize powders and changes of socks.  We could try other treatments but at this point this is the best initial alternative

## 2020-08-26 ENCOUNTER — Ambulatory Visit
Admission: RE | Admit: 2020-08-26 | Discharge: 2020-08-26 | Disposition: A | Discharge status: Home / Self Care | Payer: BC Managed Care – PPO | Source: Ambulatory Visit | Attending: Family Medicine | Admitting: Family Medicine

## 2020-08-26 ENCOUNTER — Other Ambulatory Visit: Payer: Self-pay

## 2020-08-26 DIAGNOSIS — R1032 Left lower quadrant pain: Secondary | ICD-10-CM

## 2020-08-26 DIAGNOSIS — Z8719 Personal history of other diseases of the digestive system: Secondary | ICD-10-CM

## 2020-08-26 DIAGNOSIS — R194 Change in bowel habit: Secondary | ICD-10-CM

## 2020-08-26 MED ORDER — IOPAMIDOL (ISOVUE-300) INJECTION 61%
100.0000 mL | Freq: Once | INTRAVENOUS | Status: AC | PRN
  Administered 2020-08-26: 100 mL via INTRAVENOUS

## 2020-08-27 ENCOUNTER — Telehealth: Payer: Self-pay | Admitting: Family Medicine

## 2020-08-27 ENCOUNTER — Encounter: Payer: Self-pay | Admitting: Family Medicine

## 2020-08-27 DIAGNOSIS — R9389 Abnormal findings on diagnostic imaging of other specified body structures: Secondary | ICD-10-CM

## 2020-08-27 DIAGNOSIS — R109 Unspecified abdominal pain: Secondary | ICD-10-CM

## 2020-08-27 NOTE — Telephone Encounter (Signed)
Pt was called and we discussed the findings on CT of his spleen. He provides further history of a sports injury to his spleen in 2005. He states his spleen was "leaking" and he had to be cautious for about 6 months with activity. No records available.   Referral to GI and Heme placed.

## 2020-09-01 ENCOUNTER — Telehealth: Payer: Self-pay | Admitting: Hematology and Oncology

## 2020-09-01 NOTE — Telephone Encounter (Signed)
Received a new pt referral from Dr. Raoul Pitch for abnl CT. Mr. Nick has been cld and scheduled to see Dr. Chryl Heck on 2/1 at 1040am. Pt aware to arrive 20 minutes early.

## 2020-09-07 ENCOUNTER — Encounter: Payer: Self-pay | Admitting: Hematology and Oncology

## 2020-09-07 ENCOUNTER — Inpatient Hospital Stay: Payer: BC Managed Care – PPO | Attending: Hematology and Oncology | Admitting: Hematology and Oncology

## 2020-09-07 ENCOUNTER — Inpatient Hospital Stay: Payer: BC Managed Care – PPO

## 2020-09-07 ENCOUNTER — Other Ambulatory Visit: Payer: Self-pay

## 2020-09-07 VITALS — BP 116/79 | HR 60 | Temp 97.4°F | Resp 18 | Ht 68.0 in | Wt 206.6 lb

## 2020-09-07 DIAGNOSIS — D7389 Other diseases of spleen: Secondary | ICD-10-CM | POA: Insufficient documentation

## 2020-09-07 DIAGNOSIS — R935 Abnormal findings on diagnostic imaging of other abdominal regions, including retroperitoneum: Secondary | ICD-10-CM | POA: Diagnosis not present

## 2020-09-07 DIAGNOSIS — R1032 Left lower quadrant pain: Secondary | ICD-10-CM | POA: Diagnosis not present

## 2020-09-07 LAB — CBC WITH DIFFERENTIAL/PLATELET
Abs Immature Granulocytes: 0.02 10*3/uL (ref 0.00–0.07)
Basophils Absolute: 0.1 10*3/uL (ref 0.0–0.1)
Basophils Relative: 1 %
Eosinophils Absolute: 0.2 10*3/uL (ref 0.0–0.5)
Eosinophils Relative: 3 %
HCT: 45.2 % (ref 39.0–52.0)
Hemoglobin: 14.6 g/dL (ref 13.0–17.0)
Immature Granulocytes: 0 %
Lymphocytes Relative: 31 %
Lymphs Abs: 1.9 10*3/uL (ref 0.7–4.0)
MCH: 25.6 pg — ABNORMAL LOW (ref 26.0–34.0)
MCHC: 32.3 g/dL (ref 30.0–36.0)
MCV: 79.2 fL — ABNORMAL LOW (ref 80.0–100.0)
Monocytes Absolute: 0.5 10*3/uL (ref 0.1–1.0)
Monocytes Relative: 8 %
Neutro Abs: 3.3 10*3/uL (ref 1.7–7.7)
Neutrophils Relative %: 57 %
Platelets: 271 10*3/uL (ref 150–400)
RBC: 5.71 MIL/uL (ref 4.22–5.81)
RDW: 13.2 % (ref 11.5–15.5)
WBC: 6 10*3/uL (ref 4.0–10.5)
nRBC: 0 % (ref 0.0–0.2)

## 2020-09-07 LAB — COMPREHENSIVE METABOLIC PANEL
ALT: 41 U/L (ref 0–44)
AST: 34 U/L (ref 15–41)
Albumin: 4.2 g/dL (ref 3.5–5.0)
Alkaline Phosphatase: 62 U/L (ref 38–126)
Anion gap: 6 (ref 5–15)
BUN: 10 mg/dL (ref 6–20)
CO2: 30 mmol/L (ref 22–32)
Calcium: 9.2 mg/dL (ref 8.9–10.3)
Chloride: 104 mmol/L (ref 98–111)
Creatinine, Ser: 1.08 mg/dL (ref 0.61–1.24)
GFR, Estimated: >60 mL/min (ref >60)
Glucose, Bld: 93 mg/dL (ref 70–99)
Potassium: 3.8 mmol/L (ref 3.5–5.1)
Sodium: 140 mmol/L (ref 135–145)
Total Bilirubin: 0.7 mg/dL (ref 0.3–1.2)
Total Protein: 7.9 g/dL (ref 6.5–8.1)

## 2020-09-07 LAB — SAVE SMEAR(SSMR), FOR PROVIDER SLIDE REVIEW

## 2020-09-07 LAB — LACTATE DEHYDROGENASE: LDH: 166 U/L (ref 98–192)

## 2020-09-07 NOTE — Progress Notes (Signed)
[facility] CONSULT NOTE  Patient Care Team: Ma Hillock, DO as PCP - General (Family Medicine)  CHIEF COMPLAINTS/PURPOSE OF CONSULTATION:  Abnormal abdominal imaging.  ASSESSMENT & PLAN:  No problem-specific Assessment & Plan notes found for this encounter.  No orders of the defined types were placed in this encounter.  1. Abnormal abdominal imaging, heterogenous splenic parenchyma. No concerns for B symptoms or lymphadenopathy or hepatosplenomegaly on exam. I wonder if the abnormal imaging is from history of splenic injury many years ago. No major concerns for primary hematologic disorder Ordered CBC, CMP, LDH and flow cytometry today. If negative, then I dont believe there is a primary hematologic reason for this appearance of the spleen If concerns still exist, he will benefit from surgery referral to see if he needs a laparoscopy since splenic biopsies are not recommended. No enlarged LN on CT abdomen to follow or biopsy. He will FU in 2 weeks, telephone visit and I will discuss any additional recommendations. Agree with GI evaluation for his other symptoms.  HISTORY OF PRESENTING ILLNESS:  Adam Dean 40 y.o. male is here because of abnormal CT abdomen findings.  This is a very pleasant 40 year old male patient with past medical history significant for splenic trauma back in 2005 or 2006 while playing baseball who needed emergent cholecystectomy in November 2020 in [place] while he was visiting family and since then started to note some vague left upper quadrant abdominal pain, bloating and feeling of early satiety and hence went to see his primary care physician.  He had a CT abdomen which showed heterogenous peripheral splenic parenchyma of unclear etiology.  He is now referred to hematology for additional recommendations.  He is only complaint today is some abdominal pain in the left side which is occasional has no relationship to food, resolved spontaneously  and since surgery he has also had some feeling of incomplete evacuation of his bowels, some bloating and early satiety.  He denies any changes in breathing, hematochezia, melena.  No change in urinary habits.  No B symptoms.  No new neurological complaints. Rest of the pertinent 10 point ROS reviewed and negative.  MEDICAL HISTORY:  Past Medical History:  Diagnosis Date  . Acute cholecystitis   . Chicken pox   . Gallstones 06/2019   Emergent cholecystectomy in [place].  . Migraine   . Spleen injury 2005   sports injury- "spleen leakage"    SURGICAL HISTORY: Past Surgical History:  Procedure Laterality Date  . CHOLECYSTECTOMY  06/21/2019  . INGUINAL HERNIA REPAIR     5-6 yrs old.     SOCIAL HISTORY: Social History   Socioeconomic History  . Marital status: Married    Spouse name: Not on file  . Number of children: Not on file  . Years of education: Not on file  . Highest education level: Not on file  Occupational History  . Not on file  Tobacco Use  . Smoking status: Never Smoker  . Smokeless tobacco: Never Used  Vaping Use  . Vaping Use: Never used  Substance and Sexual Activity  . Alcohol use: Never  . Drug use: Never  . Sexual activity: Yes    Partners: Female  Other Topics Concern  . Not on file  Social History Narrative   Marital status/children/pets: Married.   Education/employment: Bachelor's degree.   Safety:      -smoke alarm in the home:Yes     - wears seatbelt: Yes     -  Feels safe in their relationships: Yes   Social Determinants of Health   Financial Resource Strain: Not on file  Food Insecurity: Not on file  Transportation Needs: Not on file  Physical Activity: Not on file  Stress: Not on file  Social Connections: Not on file  Intimate Partner Violence: Not on file    FAMILY HISTORY: Family History  Problem Relation Age of Onset  . Hypertension Mother   . Birth defects Sister   . Heart attack Maternal Grandmother   . Heart attack  Maternal Grandfather     ALLERGIES:  is allergic to banana.  MEDICATIONS:  No current outpatient medications on file.   No current facility-administered medications for this visit.    PHYSICAL EXAMINATION: ECOG PERFORMANCE STATUS: 0 - Asymptomatic  Vitals:   09/07/20 1009  BP: 116/79  Pulse: 60  Resp: 18  Temp: (!) 97.4 F (36.3 C)  SpO2: 100%   Filed Weights   09/07/20 1009  Weight: 206 lb 9.6 oz (93.7 kg)    GENERAL:alert, no distress and comfortable SKIN: skin color, texture, turgor are normal, no rashes or significant lesions EYES: normal, conjunctiva are pink and non-injected, sclera clear OROPHARYNX:no exudate, no erythema and lips, buccal mucosa, and tongue normal  NECK: supple, thyroid normal size, non-tender, without nodularity LYMPH:  no palpable lymphadenopathy in the cervical, axillary or inguinal LUNGS: clear to auscultation and percussion with normal breathing effort HEART: regular rate & rhythm and no murmurs and no lower extremity edema ABDOMEN:abdomen soft, non-tender and normal bowel sounds Musculoskeletal:no cyanosis of digits and no clubbing  PSYCH: alert & oriented x 3 with fluent speech NEURO: no focal motor/sensory deficits  LABORATORY DATA:  I have reviewed the data as listed Lab Results  Component Value Date   WBC 5.4 05/05/2020   HGB 14.5 05/05/2020   HCT 44.1 05/05/2020   MCV 79.6 05/05/2020   PLT 273.0 05/05/2020     Chemistry      Component Value Date/Time   NA 140 08/11/2020 1014   K 4.0 08/11/2020 1014   CL 103 08/11/2020 1014   CO2 31 08/11/2020 1014   BUN 12 08/11/2020 1014   BUN 14 06/21/2019 0000   CREATININE 1.08 08/11/2020 1014   GLU 119 06/21/2019 0000      Component Value Date/Time   CALCIUM 9.6 08/11/2020 1014   ALKPHOS 65 08/11/2020 1014   AST 19 08/11/2020 1014   ALT 40 08/11/2020 1014   BILITOT 0.6 08/11/2020 1014       RADIOGRAPHIC STUDIES: I have personally reviewed the radiological images as listed  and agreed with the findings in the report. CT Abdomen Pelvis W Contrast  Result Date: 08/26/2020 CLINICAL DATA:  Left lower quadrant abdominal pain. Status post gallbladder and hernia repair. EXAM: CT ABDOMEN AND PELVIS WITH CONTRAST TECHNIQUE: Multidetector CT imaging of the abdomen and pelvis was performed using the standard protocol following bolus administration of intravenous contrast. CONTRAST:  168mL ISOVUE-300 IOPAMIDOL (ISOVUE-300) INJECTION 61% COMPARISON:  None. FINDINGS: Lower chest: No acute abnormality. Hepatobiliary: No focal liver abnormality. Status post cholecystectomy. No biliary dilatation. Pancreas: No focal lesion. Normal pancreatic contour. No surrounding inflammatory changes. No main pancreatic ductal dilatation. Spleen: The splenic parenchyma is heterogeneous. Normal in size without focal abnormality. Adrenals/Urinary Tract: No adrenal nodule bilaterally. Bilateral kidneys enhance symmetrically. No hydronephrosis. No hydroureter. The urinary bladder is unremarkable. Stomach/Bowel: Stomach is within normal limits. No evidence of bowel wall thickening or dilatation. Appendix appears normal. PO contrast reaches the  rectum. Vascular/Lymphatic: No significant vascular findings are present. No enlarged abdominal or pelvic lymph nodes. Reproductive: Prostate is unremarkable. Other: No intraperitoneal free fluid. No intraperitoneal free gas. No organized fluid collection. Musculoskeletal: No acute or significant osseous findings. IMPRESSION: 1. Heterogeneous peripheral splenic parenchyma of unclear etiology. 2. Otherwise no acute intra-abdominal or intrapelvic abnormality. Electronically Signed   By: Iven Finn M.D.   On: 08/26/2020 22:04    All questions were answered. The patient knows to call the clinic with any problems, questions or concerns. I spent 45 minutes in the care of this patient including H and P, review of records,documentation, counseling and coordination of care.      Benay Pike, MD 09/07/2020 10:35 AM

## 2020-09-08 LAB — SURGICAL PATHOLOGY

## 2020-09-09 LAB — FLOW CYTOMETRY

## 2020-09-21 ENCOUNTER — Inpatient Hospital Stay (HOSPITAL_BASED_OUTPATIENT_CLINIC_OR_DEPARTMENT_OTHER): Payer: BC Managed Care – PPO | Admitting: Hematology and Oncology

## 2020-09-21 DIAGNOSIS — R1032 Left lower quadrant pain: Secondary | ICD-10-CM | POA: Diagnosis not present

## 2020-09-21 DIAGNOSIS — R935 Abnormal findings on diagnostic imaging of other abdominal regions, including retroperitoneum: Secondary | ICD-10-CM | POA: Diagnosis not present

## 2020-09-21 NOTE — Progress Notes (Signed)
[facility] CONSULT NOTE  Patient Care Team: Ma Hillock, DO as PCP - General (Family Medicine)  CHIEF COMPLAINTS/PURPOSE OF CONSULTATION:  Abnormal abdominal imaging.  ASSESSMENT & PLAN:  No problem-specific Assessment & Plan notes found for this encounter.  No orders of the defined types were placed in this encounter.  1. Abnormal abdominal imaging, heterogenous splenic parenchyma. No concerns for B symptoms or lymphadenopathy or hepatosplenomegaly on exam. I wonder if the abnormal imaging is from history of splenic injury many years ago. No major concerns for primary hematologic disorder CBC, CMP, LDH and flow negative.  Repeat imaging in 3 months recommended. I also recommended surgery referral to see if he needs a laparoscopy since splenic biopsies are not recommended. He was encouraged to discuss this with his PCP. He expressed understanding No enlarged LN on CT abdomen to follow or biopsy. He will FU in 3 months. Agree with GI evaluation for his other symptoms.  FU in 3 months  HISTORY OF PRESENTING ILLNESS:  Adam Dean 40 y.o. male is here because of abnormal CT abdomen findings.  This is a very pleasant 40 year old male patient with past medical history significant for splenic trauma back in 2005 or 2006 while playing baseball who needed emergent cholecystectomy in November 2020 in [place] while he was visiting family and since then started to note some vague left upper quadrant abdominal pain, bloating and feeling of early satiety and hence went to see his primary care physician.  He had a CT abdomen which showed heterogenous peripheral splenic parenchyma of unclear etiology.   He is now referred to hematology for additional recommendations.  He complained of some abdominal pain in the left side which is occasional has no relationship to food, resolved spontaneously and since surgery he has also had some feeling of incomplete evacuation of his bowels,  some bloating and early satiety.  He denies any changes in breathing, hematochezia, melena.  No change in urinary habits.   No B symptoms.   No new neurological complaints.   Rest of the pertinent 10 point ROS reviewed and negative.  MEDICAL HISTORY:  Past Medical History:  Diagnosis Date  . Acute cholecystitis   . Chicken pox   . Gallstones 06/2019   Emergent cholecystectomy in [place].  . Migraine   . Spleen injury 2005   sports injury- "spleen leakage"    SURGICAL HISTORY: Past Surgical History:  Procedure Laterality Date  . CHOLECYSTECTOMY  06/21/2019  . INGUINAL HERNIA REPAIR     5-6 yrs old.     SOCIAL HISTORY: Social History   Socioeconomic History  . Marital status: Married    Spouse name: Not on file  . Number of children: Not on file  . Years of education: Not on file  . Highest education level: Not on file  Occupational History  . Not on file  Tobacco Use  . Smoking status: Never Smoker  . Smokeless tobacco: Never Used  Vaping Use  . Vaping Use: Never used  Substance and Sexual Activity  . Alcohol use: Never  . Drug use: Never  . Sexual activity: Yes    Partners: Female  Other Topics Concern  . Not on file  Social History Narrative   Marital status/children/pets: Married.   Education/employment: Bachelor's degree.   Safety:      -smoke alarm in the home:Yes     - wears seatbelt: Yes     - Feels safe in their relationships: Yes  Social Determinants of Health   Financial Resource Strain: Not on file  Food Insecurity: Not on file  Transportation Needs: Not on file  Physical Activity: Not on file  Stress: Not on file  Social Connections: Not on file  Intimate Partner Violence: Not on file    FAMILY HISTORY: Family History  Problem Relation Age of Onset  . Hypertension Mother   . Birth defects Sister   . Heart attack Maternal Grandmother   . Heart attack Maternal Grandfather     ALLERGIES:  is allergic to banana.  MEDICATIONS:   No current outpatient medications on file.   No current facility-administered medications for this visit.    PHYSICAL EXAMINATION: ECOG PERFORMANCE STATUS: 0 - Asymptomatic  There were no vitals filed for this visit. There were no vitals filed for this visit.  PE not done, telephone visit.  LABORATORY DATA:  I have reviewed the data as listed Lab Results  Component Value Date   WBC 6.0 09/07/2020   HGB 14.6 09/07/2020   HCT 45.2 09/07/2020   MCV 79.2 (L) 09/07/2020   PLT 271 09/07/2020     Chemistry      Component Value Date/Time   NA 140 09/07/2020 1120   K 3.8 09/07/2020 1120   CL 104 09/07/2020 1120   CO2 30 09/07/2020 1120   BUN 10 09/07/2020 1120   BUN 14 06/21/2019 0000   CREATININE 1.08 09/07/2020 1120   GLU 119 06/21/2019 0000      Component Value Date/Time   CALCIUM 9.2 09/07/2020 1120   ALKPHOS 62 09/07/2020 1120   AST 34 09/07/2020 1120   ALT 41 09/07/2020 1120   BILITOT 0.7 09/07/2020 1120     Reviewed CBC, normal except mild microcytosis CMP normal LDH normal Flow normal.  RADIOGRAPHIC STUDIES: I have personally reviewed the radiological images as listed and agreed with the findings in the report. CT Abdomen Pelvis W Contrast  Result Date: 08/26/2020 CLINICAL DATA:  Left lower quadrant abdominal pain. Status post gallbladder and hernia repair. EXAM: CT ABDOMEN AND PELVIS WITH CONTRAST TECHNIQUE: Multidetector CT imaging of the abdomen and pelvis was performed using the standard protocol following bolus administration of intravenous contrast. CONTRAST:  136mL ISOVUE-300 IOPAMIDOL (ISOVUE-300) INJECTION 61% COMPARISON:  None. FINDINGS: Lower chest: No acute abnormality. Hepatobiliary: No focal liver abnormality. Status post cholecystectomy. No biliary dilatation. Pancreas: No focal lesion. Normal pancreatic contour. No surrounding inflammatory changes. No main pancreatic ductal dilatation. Spleen: The splenic parenchyma is heterogeneous. Normal in size  without focal abnormality. Adrenals/Urinary Tract: No adrenal nodule bilaterally. Bilateral kidneys enhance symmetrically. No hydronephrosis. No hydroureter. The urinary bladder is unremarkable. Stomach/Bowel: Stomach is within normal limits. No evidence of bowel wall thickening or dilatation. Appendix appears normal. PO contrast reaches the rectum. Vascular/Lymphatic: No significant vascular findings are present. No enlarged abdominal or pelvic lymph nodes. Reproductive: Prostate is unremarkable. Other: No intraperitoneal free fluid. No intraperitoneal free gas. No organized fluid collection. Musculoskeletal: No acute or significant osseous findings. IMPRESSION: 1. Heterogeneous peripheral splenic parenchyma of unclear etiology. 2. Otherwise no acute intra-abdominal or intrapelvic abnormality. Electronically Signed   By: Iven Finn M.D.   On: 08/26/2020 22:04    All questions were answered. The patient knows to call the clinic with any problems, questions or concerns.  I connected with  Learta Codding on 09/21/20 by telephone application and verified that I am speaking with the correct person using two identifiers.   I discussed the limitations of evaluation and management by  telemedicine. The patient expressed understanding and agreed to proceed.  I spent 20 minutes in the care of this patient including History,  review of records, counseling and coordination of care.    Benay Pike, MD 09/21/2020 10:01 AM

## 2020-09-22 ENCOUNTER — Telehealth: Payer: Self-pay | Admitting: Hematology and Oncology

## 2020-09-22 NOTE — Telephone Encounter (Signed)
Scheduled appointment per 2/15 los. Spoke to patient who is aware of appointment date and time.

## 2020-10-08 ENCOUNTER — Ambulatory Visit: Payer: BC Managed Care – PPO | Admitting: Gastroenterology

## 2020-10-08 ENCOUNTER — Other Ambulatory Visit (INDEPENDENT_AMBULATORY_CARE_PROVIDER_SITE_OTHER): Payer: BC Managed Care – PPO

## 2020-10-08 ENCOUNTER — Encounter: Payer: Self-pay | Admitting: Gastroenterology

## 2020-10-08 VITALS — BP 120/80 | HR 70 | Ht 68.0 in | Wt 205.0 lb

## 2020-10-08 DIAGNOSIS — R1032 Left lower quadrant pain: Secondary | ICD-10-CM | POA: Diagnosis not present

## 2020-10-08 DIAGNOSIS — Z9049 Acquired absence of other specified parts of digestive tract: Secondary | ICD-10-CM | POA: Diagnosis not present

## 2020-10-08 DIAGNOSIS — K529 Noninfective gastroenteritis and colitis, unspecified: Secondary | ICD-10-CM

## 2020-10-08 DIAGNOSIS — G8929 Other chronic pain: Secondary | ICD-10-CM

## 2020-10-08 DIAGNOSIS — R140 Abdominal distension (gaseous): Secondary | ICD-10-CM

## 2020-10-08 LAB — CBC
HCT: 44.5 % (ref 39.0–52.0)
Hemoglobin: 14.8 g/dL (ref 13.0–17.0)
MCHC: 33.3 g/dL (ref 30.0–36.0)
MCV: 78.8 fl (ref 78.0–100.0)
Platelets: 276 10*3/uL (ref 150.0–400.0)
RBC: 5.64 Mil/uL (ref 4.22–5.81)
RDW: 14.2 % (ref 11.5–15.5)
WBC: 5.5 10*3/uL (ref 4.0–10.5)

## 2020-10-08 LAB — HIGH SENSITIVITY CRP: CRP, High Sensitivity: 2.23 mg/L (ref 0.000–5.000)

## 2020-10-08 LAB — SEDIMENTATION RATE: Sed Rate: 12 mm/hr (ref 0–15)

## 2020-10-08 LAB — TSH: TSH: 1.78 u[IU]/mL (ref 0.35–4.50)

## 2020-10-08 MED ORDER — CHOLESTYRAMINE 4 G PO PACK: 4.0000 g | PACK | Freq: Two times a day (BID) | ORAL | 12 refills | Status: DC

## 2020-10-08 MED ORDER — PLENVU 140 G PO SOLR: 1.0000 | ORAL | 0 refills | Status: DC

## 2020-10-08 NOTE — Patient Instructions (Signed)
Your provider has requested that you go to the basement level for lab work before leaving today. Press "B" on the elevator. The lab is located at the first door on the left as you exit the elevator.  Due to recent changes in healthcare laws, you may see the results of your imaging and laboratory studies on MyChart before your provider has had a chance to review them.  We understand that in some cases there may be results that are confusing or concerning to you. Not all laboratory results come back in the same time frame and the provider may be waiting for multiple results in order to interpret others.  Please give [place] 48 hours in order for your provider to thoroughly review all the results before contacting the office for clarification of your results.    If you are age 25 or younger, your body mass index should be between 19-25. Your Body mass index is 31.17 kg/m. If this is out of the aformentioned range listed, please consider follow up with your Primary Care Provider.   We have sent the following medications to your pharmacy for you to pick up at your convenience: Plenvu and Cholestyramine  START: Cholestyramine twice daily as directed.   You have been scheduled for an endoscopy and colonoscopy. Please follow the written instructions given to you at your visit today. Please pick up your prep supplies at the pharmacy within the next 1-3 days. If you use inhalers (even only as needed), please bring them with you on the day of your procedure.   Thank you for choosing me and [facility].  Dr. Rush Landmark

## 2020-10-08 NOTE — Progress Notes (Signed)
[facility] VISIT   Primary Care Provider Ma Hillock, DO [address] [place] [address] [phone]  Referring Provider Ma Hillock, DO [address] [place],  [place] [address] [phone]  Patient Profile: Adam Dean is a 40 y.o. male with a pmh significant for migraines, status post cholecystectomy, reported splenic laceration (years previously).  The patient presents to the [facility] for an evaluation and management of problem(s) noted below:  Problem List 1. Abdominal pain, chronic, left lower quadrant   2. Chronic diarrhea   3. History of cholecystectomy     History of Present Illness This is the patient's first visit to the outpatient [facility] clinic.  Describes in 2020 presenting while away in [place] with significant pain and subsequently hospital admission requiring cholecystectomy.  Patient returned [facility]  A few months later, he began to experience pain in the lower abdomen.  This was different than the discomfort he had when he presented with cholecystitis.  Patient will have episodes of discomfort that come and go and he did not think too much of it but did discuss this with his primary care provider.  The discomfort however has become more frequent and at times can be constant with a dullness and discomfort there mostly in the left lower region.  He describes the discomfort as a 1-3 out of 10 but he is able to function and do everything that he needs to.  The patient has noted increased bloating that occurs after food intake on most occasions.  Patient describes a relatively quick gastrocolic reflex after having a meal that he will need to use the restroom.  He can have between 5 and 6 bowel movements on a daily basis.  He does feel incomplete evacuation at times.  He describes no blood in his stools.  The patient's bowel habits prior to his cholecystectomy were relatively normal occurring once to twice  daily.  The patient has not used any bulking fiber and does not use antidiarrheals.  The patient does not take significant nonsteroidals or BC/Goody powders.  He has never had an upper or lower endoscopy.  GI Review of Systems Positive as above Negative for dysphagia, odynophagia, pyrosis, nausea, vomiting  Review of Systems General: Denies fevers/chills/weight loss unintentionally HEENT: Denies oral lesions Cardiovascular: Denies chest pain/palpitations Pulmonary: Denies shortness of breath Gastroenterological: See HPI Genitourinary: Denies darkened urine or hematuria Hematological: Denies easy bruising/bleeding Endocrine: Denies temperature intolerance Dermatological: Denies jaundice Psychological: Mood is stable   Medications Current Outpatient Medications  Medication Sig Dispense Refill  . cholestyramine (QUESTRAN) 4 g packet Take 1 packet (4 g total) by mouth 2 (two) times daily. 60 each 12  . Multiple Vitamin (MULTIVITAMIN) tablet Take 1 tablet by mouth daily.    Marland Kitchen PEG-KCl-NaCl-NaSulf-Na Asc-C (PLENVU) 140 g SOLR Take 1 kit by mouth as directed. Use coupon: BIN: [id number] PNC: CNRX Group: [id number] ID: [id number] 1 each 0   No current facility-administered medications for this visit.    Allergies Allergies  Allergen Reactions  . Banana Diarrhea and Nausea And Vomiting    Histories Past Medical History:  Diagnosis Date  . Acute cholecystitis   . Chicken pox   . Gallstones 06/2019   Emergent cholecystectomy in [place].  . Migraine   . Spleen injury 2005   sports injury- "spleen leakage"   Past Surgical History:  Procedure Laterality Date  . CHOLECYSTECTOMY  06/21/2019  . INGUINAL HERNIA REPAIR  5-6 yrs old.    Social History   Socioeconomic History  . Marital status: Married    Spouse name: Not on file  . Number of children: Not on file  . Years of education: Not on file  . Highest education level: Not on file  Occupational History  . Not on file   Tobacco Use  . Smoking status: Never Smoker  . Smokeless tobacco: Never Used  Vaping Use  . Vaping Use: Never used  Substance and Sexual Activity  . Alcohol use: Never  . Drug use: Never  . Sexual activity: Yes    Partners: Female  Other Topics Concern  . Not on file  Social History Narrative   Marital status/children/pets: Married.   Education/employment: Bachelor's degree.   Safety:      -smoke alarm in the home:Yes     - wears seatbelt: Yes     - Feels safe in their relationships: Yes   Social Determinants of Health   Financial Resource Strain: Not on file  Food Insecurity: Not on file  Transportation Needs: Not on file  Physical Activity: Not on file  Stress: Not on file  Social Connections: Not on file  Intimate Partner Violence: Not on file   Family History  Problem Relation Age of Onset  . Hypertension Mother   . Birth defects Sister   . Heart attack Maternal Grandmother   . Heart attack Maternal Grandfather   . Colon polyps Neg Hx   . Colon cancer Neg Hx   . Esophageal cancer Neg Hx   . Inflammatory bowel disease Neg Hx   . Liver disease Neg Hx   . Pancreatic cancer Neg Hx   . Stomach cancer Neg Hx   . Rectal cancer Neg Hx    I have reviewed his medical, social, and family history in detail and updated the electronic medical record as necessary.    PHYSICAL EXAMINATION  BP 120/80   Pulse 70   Ht '5\' 8"'  (1.727 m)   Wt 205 lb (93 kg)   BMI 31.17 kg/m  Wt Readings from Last 3 Encounters:  10/08/20 205 lb (93 kg)  09/07/20 206 lb 9.6 oz (93.7 kg)  08/09/20 204 lb 2 oz (92.6 kg)  GEN: NAD, appears stated age, doesn't appear chronically ill PSYCH: Cooperative, without pressured speech EYE: Conjunctivae pink, sclerae anicteric ENT: Masked CV: RR without R/Gs  RESP: CTAB posteriorly, without wheezing GI: NABS, soft, tenderness to palpation lower abdomen bilaterally, nondistended, surgical scars present from prior laparoscopy, without rebound or  guarding, no HSM appreciated MSK/EXT: No lower extremity edema SKIN: No jaundice NEURO:  Alert & Oriented x 3, no focal deficits   REVIEW OF DATA  I reviewed the following data at the time of this encounter:  GI Procedures and Studies  No relevant studies to review  Laboratory Studies  Reviewed those in epic  Imaging Studies  January 2022 CT abdomen pelvis with contrast IMPRESSION: 1. Heterogeneous peripheral splenic parenchyma of unclear etiology. 2. Otherwise no acute intra-abdominal or intrapelvic abnormality.   ASSESSMENT  Mr. Marchitto is a 40 y.o. male with a pmh significant for migraines, status post cholecystectomy, reported splenic laceration (years previously).  The patient is seen today for evaluation and management of:  1. Abdominal pain, chronic, left lower quadrant   2. Chronic diarrhea   3. History of cholecystectomy    The patient is hemodynamically stable.  Clinically, he has had ongoing issues that have become more pronounced over the  course the last few months.  Etiology of his symptoms remains unclear.  We will however try to improve some of his bowel habits and see if that makes some difference with the use of cholestyramine in the setting of what likely has some component of bile salt diarrhea.  We went over proper toileting techniques in order to try and optimize his sensations of incomplete evacuation.  We will try to bulk his stool as well with fiber supplementation.  Endoscopic evaluation from above and below is recommended to further evaluate symptoms.  We will obtain laboratories as well to rule out other potential etiologies of chronic diarrhea.  Less likely to be IBD but if inflammatory markers are elevated that may lead [place] to have more concerned about the potential of that.  The risks and benefits of endoscopic evaluation were discussed with the patient; these include but are not limited to the risk of perforation, infection, bleeding, missed lesions, lack  of diagnosis, severe illness requiring hospitalization, as well as anesthesia and sedation related illnesses.  The patient is agreeable to proceed.  All patient questions were answered to the best of my ability, and the patient agrees to the aforementioned plan of action with follow-up as indicated.   PLAN  Laboratories as outlined below Diagnostic endoscopy/colonoscopy (gastric/duodenal/TI/colon biopsies to be obtained at minimum) Consider EPI and SIBO in future Initiate cholestyramine 1 packet 2 times daily for now may consider further titration in future FiberCon once to twice daily or Metamucil once to twice daily in order to bulk stool Toileting techniques discussed with patient   Orders Placed This Encounter  Procedures  . CBC  . TSH  . Sedimentation rate  . CRP High sensitivity  . Tissue transglutaminase, IgA  . IgA  . Ambulatory referral to Gastroenterology    New Prescriptions   CHOLESTYRAMINE Lucrezia Starch) 4 G PACKET    Take 1 packet (4 g total) by mouth 2 (two) times daily.   PEG-KCL-NACL-NASULF-NA ASC-C (PLENVU) 140 G SOLR    Take 1 kit by mouth as directed. Use coupon: BIN: [id number] PNC: CNRX Group: [id number] ID: [id number]   Modified Medications   No medications on file    Planned Follow Up No follow-ups on file.   Total Time in Face-to-Face and in Coordination of Care for patient including independent/personal interpretation/review of prior testing, medical history, examination, medication adjustment, communicating results with the patient directly, and documentation with the EHR is 45 minutes.   Justice Britain, MD [facility] Office # [phone]

## 2020-10-11 LAB — TISSUE TRANSGLUTAMINASE, IGA: (tTG) Ab, IgA: <1 U/mL

## 2020-10-11 LAB — IGA: Immunoglobulin A: 218 mg/dL (ref 47–310)

## 2020-10-12 ENCOUNTER — Encounter: Payer: Self-pay | Admitting: Gastroenterology

## 2020-10-12 DIAGNOSIS — K529 Noninfective gastroenteritis and colitis, unspecified: Secondary | ICD-10-CM | POA: Insufficient documentation

## 2020-10-12 DIAGNOSIS — Z9049 Acquired absence of other specified parts of digestive tract: Secondary | ICD-10-CM | POA: Insufficient documentation

## 2020-10-12 DIAGNOSIS — R140 Abdominal distension (gaseous): Secondary | ICD-10-CM | POA: Insufficient documentation

## 2020-10-12 DIAGNOSIS — G8929 Other chronic pain: Secondary | ICD-10-CM | POA: Insufficient documentation

## 2020-11-17 ENCOUNTER — Ambulatory Visit (AMBULATORY_SURGERY_CENTER): Payer: BC Managed Care – PPO | Admitting: Gastroenterology

## 2020-11-17 ENCOUNTER — Other Ambulatory Visit: Payer: Self-pay

## 2020-11-17 ENCOUNTER — Other Ambulatory Visit: Payer: Self-pay | Admitting: Gastroenterology

## 2020-11-17 ENCOUNTER — Encounter: Payer: Self-pay | Admitting: Gastroenterology

## 2020-11-17 VITALS — BP 109/62 | HR 88 | Temp 98.6°F | Resp 20 | Ht 68.0 in | Wt 205.0 lb

## 2020-11-17 DIAGNOSIS — K2100 Gastro-esophageal reflux disease with esophagitis, without bleeding: Secondary | ICD-10-CM | POA: Diagnosis not present

## 2020-11-17 DIAGNOSIS — K529 Noninfective gastroenteritis and colitis, unspecified: Secondary | ICD-10-CM | POA: Diagnosis not present

## 2020-11-17 DIAGNOSIS — R1032 Left lower quadrant pain: Secondary | ICD-10-CM

## 2020-11-17 DIAGNOSIS — K648 Other hemorrhoids: Secondary | ICD-10-CM

## 2020-11-17 DIAGNOSIS — K621 Rectal polyp: Secondary | ICD-10-CM

## 2020-11-17 DIAGNOSIS — D128 Benign neoplasm of rectum: Secondary | ICD-10-CM

## 2020-11-17 DIAGNOSIS — K200 Eosinophilic esophagitis: Secondary | ICD-10-CM

## 2020-11-17 DIAGNOSIS — D129 Benign neoplasm of anus and anal canal: Secondary | ICD-10-CM | POA: Diagnosis not present

## 2020-11-17 DIAGNOSIS — K2970 Gastritis, unspecified, without bleeding: Secondary | ICD-10-CM

## 2020-11-17 DIAGNOSIS — K2950 Unspecified chronic gastritis without bleeding: Secondary | ICD-10-CM

## 2020-11-17 DIAGNOSIS — G8929 Other chronic pain: Secondary | ICD-10-CM

## 2020-11-17 HISTORY — PX: COLONOSCOPY WITH ESOPHAGOGASTRODUODENOSCOPY (EGD): SHX5779

## 2020-11-17 MED ORDER — OMEPRAZOLE 40 MG PO CPDR: 40.0000 mg | DELAYED_RELEASE_CAPSULE | Freq: Every day | ORAL | 3 refills | Status: DC

## 2020-11-17 MED ORDER — SODIUM CHLORIDE 0.9 % IV SOLN: 500.0000 mL | Freq: Once | INTRAVENOUS | Status: DC

## 2020-11-17 MED ORDER — OMEPRAZOLE 40 MG PO CPDR: 40.0000 mg | DELAYED_RELEASE_CAPSULE | Freq: Every day | ORAL | 3 refills | Status: AC

## 2020-11-17 NOTE — Progress Notes (Signed)
Pt's states no medical or surgical changes since previsit or office visit.  CW - vitals 

## 2020-11-17 NOTE — Op Note (Signed)
[facility] Patient Name: Adam Dean Procedure Date: 11/17/2020 3:59 PM MRN: [id number] Endoscopist: Justice Britain , MD Age: 40 Referring MD:  Date of Birth: 17-Apr-1981 Gender: Male Account #: [id number] Procedure:                Colonoscopy Indications:              Abdominal pain in the left lower quadrant, Chronic                            diarrhea Medicines:                Monitored Anesthesia Care Procedure:                Pre-Anesthesia Assessment:                           - Prior to the procedure, a History and Physical                            was performed, and patient medications and                            allergies were reviewed. The patient's tolerance of                            previous anesthesia was also reviewed. The risks                            and benefits of the procedure and the sedation                            options and risks were discussed with the patient.                            All questions were answered, and informed consent                            was obtained. Prior Anticoagulants: The patient has                            taken no previous anticoagulant or antiplatelet                            agents. ASA Grade Assessment: II - A patient with                            mild systemic disease. After reviewing the risks                            and benefits, the patient was deemed in                            satisfactory condition to undergo the procedure.  After obtaining informed consent, the colonoscope                            was passed under direct vision. Throughout the                            procedure, the patient's blood pressure, pulse, and                            oxygen saturations were monitored continuously. The                            Olympus [id number]) [id number] was introduced                            through the anus and advanced to the 8 cm into  the                            ileum. The colonoscopy was performed without                            difficulty. The patient tolerated the procedure.                            The quality of the bowel preparation was good. The                            terminal ileum, ileocecal valve, appendiceal                            orifice, and rectum were photographed. Scope In: 4:18:13 PM Scope Out: 4:33:36 PM Scope Withdrawal Time: 0 hours 12 minutes 48 seconds  Total Procedure Duration: 0 hours 15 minutes 23 seconds  Findings:                 The digital rectal exam findings include                            hemorrhoids. Pertinent negatives include no                            palpable rectal lesions.                           The terminal ileum and ileocecal valve appeared                            normal. Biopsies were taken with a cold forceps for                            histology to rule out chronic ileitis.                           Normal mucosa was found in the entire colon.  Biopsies for histology were taken with a cold                            forceps from the entire colon for evaluation of                            microscopic colitis.                           A 4 mm polypoid lesion was found on a hemorrhoid.                            The lesion was semi-sessile. No bleeding was                            present. This could be a developing anal papillae                            vs other etiology. This was biopsied off with a                            cold forceps for histology.                           Non-bleeding non-thrombosed internal hemorrhoids                            were found during retroflexion, during perianal                            exam and during digital exam. The hemorrhoids were                            Grade II (internal hemorrhoids that prolapse but                            reduce  spontaneously). Complications:            No immediate complications. Estimated Blood Loss:     Estimated blood loss was minimal. Impression:               - Hemorrhoids found on digital rectal exam.                           - The examined portion of the ileum was normal.                            Biopsied.                           - Normal mucosa in the entire examined colon.                            Biopsied.                           -  Polypoid lesion at the dentate line. Biopsied.                           - Non-bleeding non-thrombosed internal hemorrhoids. Recommendation:           - The patient will be observed post-procedure,                            until all discharge criteria are met.                           - Discharge patient to home.                           - Patient has a contact number available for                            emergencies. The signs and symptoms of potential                            delayed complications were discussed with the                            patient. Return to normal activities tomorrow.                            Written discharge instructions were provided to the                            patient.                           - High fiber diet.                           - Metamucil 1-2 times daily to bulk the stool.                           - May initiate Cholestyramine for attempt at seeing                            if symptoms are due to post-cholecystectomy                            syndrome.                           - Continue present medications.                           - Await pathology results.                           - Repeat colonoscopy for surveillance based on                            pathology results. If no  evidence of any                            abnormalities then would recommend a 10-year                            colonoscopy for colon cancer screening. If any                            abnormalities  or dysplasia noted on the anal                            polypoid lesion then may require CRS evaluation and                            an earlier followup colonoscopy.                           - The findings and recommendations were discussed                            with the patient.                           - The findings and recommendations were discussed                            with the patient's family. Justice Britain, MD 11/17/2020 4:47:00 PM

## 2020-11-17 NOTE — Op Note (Signed)
[facility] Patient Name: Adam Dean Procedure Date: 11/17/2020 4:00 PM MRN: [id number] Endoscopist: Justice Britain , MD Age: 40 Referring MD:  Date of Birth: 1980-11-18 Gender: Male Account #: [id number] Procedure:                Upper GI endoscopy Indications:              Abdominal pain in the left lower quadrant, Diarrhea Medicines:                Monitored Anesthesia Care Procedure:                Pre-Anesthesia Assessment:                           - Prior to the procedure, a History and Physical                            was performed, and patient medications and                            allergies were reviewed. The patient's tolerance of                            previous anesthesia was also reviewed. The risks                            and benefits of the procedure and the sedation                            options and risks were discussed with the patient.                            All questions were answered, and informed consent                            was obtained. Prior Anticoagulants: The patient has                            taken no previous anticoagulant or antiplatelet                            agents. ASA Grade Assessment: II - A patient with                            mild systemic disease. After reviewing the risks                            and benefits, the patient was deemed in                            satisfactory condition to undergo the procedure.                           After obtaining informed consent, the endoscope was  passed under direct vision. Throughout the                            procedure, the patient's blood pressure, pulse, and                            oxygen saturations were monitored continuously. The                            Endoscope was introduced through the mouth, and                            advanced to the second part of duodenum. The upper                            GI  endoscopy was accomplished without difficulty.                            The patient tolerated the procedure. Scope In: Scope Out: Findings:                 Mucosal changes including circumferential folds and                            longitudinal markings were found in the entire                            esophagus. Esophageal findings were graded as:                            Edema Grade 0 Normal (distinct vascular markings),                            Rings Grade 1 Mild (subtle circumferential ridges                            seen on esophageal distension), Exudates Grade 0                            None (no white lesions seen), Furrows Grade 1                            Present (vertical lines with or without visible                            depth) and Stricture none (no stricture found).                            Biopsies were taken with a cold forceps for                            histology.                           LA Grade C (  one or more mucosal breaks continuous                            between tops of 2 or more mucosal folds, less than                            75% circumference) esophagitis with no bleeding was                            found in the distal esophagus to the [place].                            Biopsies were taken with a cold forceps for                            histology to rule out EoE/LoE.                           The Z-line was irregular and was found 40 cm from                            the incisors.                           A 1 cm hiatal hernia was present.                           Striped mildly erythematous mucosa without bleeding                            was found in the gastric body and in the gastric                            antrum.                           No other gross lesions were noted in the entire                            examined stomach. Biopsies were taken with a cold                            forceps for  histology and Helicobacter pylori                            testing.                           No gross lesions were noted in the duodenal bulb,                            in the first portion of the duodenum and in the  second portion of the duodenum. Biopsies for                            histology were taken with a cold forceps for                            evaluation of celiac disease/enteropathies. Complications:            No immediate complications. Estimated Blood Loss:     Estimated blood loss was minimal. Impression:               - Esophageal mucosal changes suspicious for                            eosinophilic esophagitis. Biopsied.                           - LA Grade C reflux esophagitis with no bleeding at                            distal esophagus/GEJxn. Biopsied.                           - Z-line irregular, 40 cm from the incisors.                           - 1 cm hiatal hernia.                           - Erythematous mucosa in the gastric body and                            antrum. No other gross lesions in the stomach.                            Biopsied.                           - No gross lesions in the duodenal bulb, in the                            first portion of the duodenum and in the second                            portion of the duodenum. Biopsied. Recommendation:           - Proceed to scheduled colonoscopy.                           - Await pathology results.                           - Observe patient's clinical course.                           - Start Omeprazole 40 mg twice daily to aid in  healing of esophagitis.                           - Repeat upper endoscopy in 3-4 months to check                            healing.                           - The findings and recommendations were discussed                            with the patient.                           - The findings and  recommendations were discussed                            with the patient's family. Justice Britain, MD 11/17/2020 4:41:05 PM

## 2020-11-17 NOTE — Patient Instructions (Signed)
Handout given;  Hiatal hernia, polyps, Hemorrhoids, esophagitis,High Fiber Diet Start High fiber diet Start metamucil 1-2 times daily Continue current medications Await pathology results  YOU HAD AN ENDOSCOPIC PROCEDURE TODAY AT [facility]:   Refer to the procedure report that was given to you for any specific questions about what was found during the examination.  If the procedure report does not answer your questions, please call your gastroenterologist to clarify.  If you requested that your care partner not be given the details of your procedure findings, then the procedure report has been included in a sealed envelope for you to review at your convenience later.  YOU SHOULD EXPECT: Some feelings of bloating in the abdomen. Passage of more gas than usual.  Walking can help get rid of the air that was put into your GI tract during the procedure and reduce the bloating. If you had a lower endoscopy (such as a colonoscopy or flexible sigmoidoscopy) you may notice spotting of blood in your stool or on the toilet paper. If you underwent a bowel prep for your procedure, you may not have a normal bowel movement for a few days.  Please Note:  You might notice some irritation and congestion in your nose or some drainage.  This is from the oxygen used during your procedure.  There is no need for concern and it should clear up in a day or so.  SYMPTOMS TO REPORT IMMEDIATELY:   Following lower endoscopy (colonoscopy or flexible sigmoidoscopy):  Excessive amounts of blood in the stool  Significant tenderness or worsening of abdominal pains  Swelling of the abdomen that is new, acute  Fever of 100F or higher   Following upper endoscopy (EGD)  Vomiting of blood or coffee ground material  New chest pain or pain under the shoulder blades  Painful or persistently difficult swallowing  New shortness of breath  Fever of 100F or higher  Black, tarry-looking stools  For urgent or  emergent issues, a gastroenterologist can be reached at any hour by calling [phone]. Do not use MyChart messaging for urgent concerns.   DIET:  We do recommend a small meal at first, but then you may proceed to your regular diet.  Drink plenty of fluids but you should avoid alcoholic beverages for 24 hours.  ACTIVITY:  You should plan to take it easy for the rest of today and you should NOT DRIVE or use heavy machinery until tomorrow (because of the sedation medicines used during the test).    FOLLOW UP: Our staff will call the number listed on your records 48-72 hours following your procedure to check on you and address any questions or concerns that you may have regarding the information given to you following your procedure. If we do not reach you, we will leave a message.  We will attempt to reach you two times.  During this call, we will ask if you have developed any symptoms of COVID 19. If you develop any symptoms (ie: fever, flu-like symptoms, shortness of breath, cough etc.) before then, please call [phone].  If you test positive for Covid 19 in the 2 weeks post procedure, please call and report this information to [place].    If any biopsies were taken you will be contacted by phone or by letter within the next 1-3 weeks.  Please call us at [phone] if you have not heard about the biopsies in 3 weeks.   SIGNATURES/CONFIDENTIALITY: You and/or your care partner have  signed paperwork which will be entered into your electronic medical record.  These signatures attest to the fact that that the information above on your After Visit Summary has been reviewed and is understood.  Full responsibility of the confidentiality of this discharge information lies with you and/or your care-partner.

## 2020-11-17 NOTE — Progress Notes (Signed)
Called to room to assist during endoscopic procedure.  Patient ID and intended procedure confirmed with present staff. Received instructions for my participation in the procedure from the performing physician.  

## 2020-11-17 NOTE — Progress Notes (Signed)
To PACU VSS. Report to [email or web address] 

## 2020-11-22 ENCOUNTER — Telehealth: Payer: Self-pay | Admitting: *Deleted

## 2020-11-22 NOTE — Telephone Encounter (Signed)
  Follow up Call-  Call back number 11/17/2020  Post procedure Call Back phone  # [phone]  Permission to leave phone message Yes    LMOM to call back with any questions or concerns.  Also, call back if patient has developed fever, respiratory issues or been dx with COVID or had any family members or close contacts diagnosed since her procedure.

## 2020-11-22 NOTE — Telephone Encounter (Signed)
Follow up call made. 

## 2020-11-26 ENCOUNTER — Encounter (HOSPITAL_COMMUNITY): Payer: Self-pay

## 2020-11-26 ENCOUNTER — Ambulatory Visit (HOSPITAL_COMMUNITY)
Admission: RE | Admit: 2020-11-26 | Discharge: 2020-11-26 | Disposition: A | Discharge status: Home / Self Care | Payer: BC Managed Care – PPO | Source: Ambulatory Visit | Attending: Hematology and Oncology | Admitting: Hematology and Oncology

## 2020-11-26 ENCOUNTER — Other Ambulatory Visit: Payer: Self-pay

## 2020-11-26 DIAGNOSIS — R935 Abnormal findings on diagnostic imaging of other abdominal regions, including retroperitoneum: Secondary | ICD-10-CM | POA: Insufficient documentation

## 2020-11-26 MED ORDER — IOHEXOL 300 MG/ML  SOLN
100.0000 mL | Freq: Once | INTRAMUSCULAR | Status: AC | PRN
  Administered 2020-11-26: 100 mL via INTRAVENOUS

## 2020-11-29 ENCOUNTER — Encounter: Payer: Self-pay | Admitting: Gastroenterology

## 2020-12-15 ENCOUNTER — Telehealth: Payer: Self-pay | Admitting: Hematology and Oncology

## 2020-12-15 ENCOUNTER — Telehealth: Payer: Self-pay

## 2020-12-15 ENCOUNTER — Encounter: Payer: Self-pay | Admitting: Hematology and Oncology

## 2020-12-15 ENCOUNTER — Inpatient Hospital Stay: Payer: BC Managed Care – PPO | Attending: Hematology and Oncology | Admitting: Hematology and Oncology

## 2020-12-15 ENCOUNTER — Other Ambulatory Visit: Payer: Self-pay

## 2020-12-15 DIAGNOSIS — R6881 Early satiety: Secondary | ICD-10-CM | POA: Insufficient documentation

## 2020-12-15 DIAGNOSIS — Z8249 Family history of ischemic heart disease and other diseases of the circulatory system: Secondary | ICD-10-CM | POA: Diagnosis not present

## 2020-12-15 DIAGNOSIS — Z9049 Acquired absence of other specified parts of digestive tract: Secondary | ICD-10-CM | POA: Insufficient documentation

## 2020-12-15 DIAGNOSIS — R1012 Left upper quadrant pain: Secondary | ICD-10-CM | POA: Insufficient documentation

## 2020-12-15 DIAGNOSIS — Z79899 Other long term (current) drug therapy: Secondary | ICD-10-CM | POA: Insufficient documentation

## 2020-12-15 DIAGNOSIS — R935 Abnormal findings on diagnostic imaging of other abdominal regions, including retroperitoneum: Secondary | ICD-10-CM | POA: Insufficient documentation

## 2020-12-15 DIAGNOSIS — K648 Other hemorrhoids: Secondary | ICD-10-CM | POA: Insufficient documentation

## 2020-12-15 DIAGNOSIS — R140 Abdominal distension (gaseous): Secondary | ICD-10-CM | POA: Diagnosis not present

## 2020-12-15 NOTE — Telephone Encounter (Signed)
Scheduled follow-up appointment per 5/11 los. Patient is aware. ?

## 2020-12-15 NOTE — Telephone Encounter (Signed)
Instructed patient that there is a medical records release form at the front desk for him to fill out at his earliest convenience so that we can request records from outside facility. Patient verbalized understanding.

## 2020-12-15 NOTE — Assessment & Plan Note (Addendum)
1. Abnormal abdominal imaging, heterogenous splenic parenchyma. No concerns for B symptoms or lymphadenopathy or hepatosplenomegaly on exam. I wonder if the abnormal imaging is from history of splenic injury many years ago. No major concerns for primary hematologic disorder CBC, CMP, LDH and flow negative.  We have discussed repeated imaging and he most recently had a CT which showed persistently heterogenous splenic parenchyma with no associated splenomegaly, unclear etiology. Finding could be artifactual versus represent underlying disease. He continues to feel well, no concerning symptoms.  Physical examination no concerning findings.  I reviewed imaging with patient today and discussed that this is likely an incidental finding and again as we discussed may not be related to his abdominal pain which has improved since last visit.  I recommended that he also talk to his surgeon Dr. Dema Severin, he is scheduled to see him next month about this abnormal splenic imaging findings and to see if there is any role for laparoscopy for further evaluation.  I have also sent a message to Dr. Dema Severin myself requesting recommendations regarding this.  Patient expressed understanding.  We will also request records from hospital in [place] when he had his cholecystectomy to see if he had this splenic abnormalities prior to cholecystectomy. He will return to clinic in 6 months.  He was instructed to call us with any new concerning symptoms. Thank you for consulting [place] in the care of this patient.  Please do not hesitate to contact us with any additional questions or concerns.

## 2020-12-15 NOTE — Progress Notes (Addendum)
[facility] CONSULT NOTE  Patient Care Team: Ma Hillock, DO as PCP - General (Family Medicine)  CHIEF COMPLAINTS/PURPOSE OF CONSULTATION:  Abnormal abdominal imaging.  ASSESSMENT & PLAN:   Abnormal findings on diagnostic imaging of abdomen 1. Abnormal abdominal imaging, heterogenous splenic parenchyma. No concerns for B symptoms or lymphadenopathy or hepatosplenomegaly on exam. I wonder if the abnormal imaging is from history of splenic injury many years ago. No major concerns for primary hematologic disorder CBC, CMP, LDH and flow negative.  We have discussed repeated imaging and he most recently had a CT which showed persistently heterogenous splenic parenchyma with no associated splenomegaly, unclear etiology. Finding could be artifactual versus represent underlying disease. He continues to feel well, no concerning symptoms.  Physical examination no concerning findings.  I reviewed imaging with patient today and discussed that this is likely an incidental finding and again as we discussed may not be related to his abdominal pain which has improved since last visit.  I recommended that he also talk to his surgeon Dr. Dema Severin, he is scheduled to see him next month about this abnormal splenic imaging findings and to see if there is any role for laparoscopy for further evaluation.  I have also sent a message to Dr. Dema Severin myself requesting recommendations regarding this.  Patient expressed understanding.  We will also request records from hospital in [place] when he had his cholecystectomy to see if he had this splenic abnormalities prior to cholecystectomy. He will return to clinic in 6 months.  He was instructed to call us with any new concerning symptoms. Thank you for consulting [place] in the care of this patient.  Please do not hesitate to contact us with any additional questions or concerns.  No orders of the defined types were placed in this encounter.   Addendum: Spoke to  Dr Candise Che from radiology who reviewed imaging and said the findings are artifactual, he doesn't think there is any splenic abnormality and this doesn't need follow up, this is likely from the technique of imaging. He can cancel his FU with [place].  HISTORY OF PRESENTING ILLNESS:   Adam Dean 40 y.o. male is here because of abnormal CT abdomen findings.  This is a very pleasant 40 year old male patient with past medical history significant for splenic trauma back in 2005 or 2006 while playing baseball who needed emergent cholecystectomy in November 2020 in [place] while he was visiting family and since then started to note some vague left upper quadrant abdominal pain, bloating and feeling of early satiety and hence went to see his primary care physician.  He had a CT abdomen which showed heterogenous peripheral splenic parenchyma of unclear etiology.  Repeat imaging Apr 2022 showed persistently heterogenous splenic parenchyma with no associated splenomegaly, unclear etiology, finding could be artifactual versus represent underlying disease. Flow showed no monoclonal B cell population or abnormal T cell phenotype identified.   Interval History  Patient is here for a follow up. He is doing well.  No B symptoms. He recently had an endoscopy and colonoscopy.  Endoscopy showed mucosal changes including circumferential folds and longitudinal markings in the entire esophagus.  He is now on proton pump inhibitor he has been less abdominal pain and more regular bowel habits.  Colonoscopy showed hemorrhoids, 4 mm centimeter sessile polypoid lesion found on a hemorrhoid.  Nonbleeding nonthrombosed internal hemorrhoids found during retroflexion. Rest of the pertinent 10 point ROS reviewed and negative.  MEDICAL HISTORY:  Past Medical History:  Diagnosis Date  . Acute cholecystitis   . Chicken pox   . Gallstones 06/2019   Emergent cholecystectomy in [place].  . Migraine   . Spleen injury 2005    sports injury- "spleen leakage"    SURGICAL HISTORY: Past Surgical History:  Procedure Laterality Date  . CHOLECYSTECTOMY  06/21/2019  . INGUINAL HERNIA REPAIR     5-6 yrs old.     SOCIAL HISTORY: Social History   Socioeconomic History  . Marital status: Married    Spouse name: Not on file  . Number of children: Not on file  . Years of education: Not on file  . Highest education level: Not on file  Occupational History  . Not on file  Tobacco Use  . Smoking status: Never Smoker  . Smokeless tobacco: Never Used  Vaping Use  . Vaping Use: Never used  Substance and Sexual Activity  . Alcohol use: Never  . Drug use: Never  . Sexual activity: Yes    Partners: Female  Other Topics Concern  . Not on file  Social History Narrative   Marital status/children/pets: Married.   Education/employment: Bachelor's degree.   Safety:      -smoke alarm in the home:Yes     - wears seatbelt: Yes     - Feels safe in their relationships: Yes   Social Determinants of Health   Financial Resource Strain: Not on file  Food Insecurity: Not on file  Transportation Needs: Not on file  Physical Activity: Not on file  Stress: Not on file  Social Connections: Not on file  Intimate Partner Violence: Not on file    FAMILY HISTORY: Family History  Problem Relation Age of Onset  . Hypertension Mother   . Birth defects Sister   . Heart attack Maternal Grandmother   . Heart attack Maternal Grandfather   . Colon polyps Neg Hx   . Colon cancer Neg Hx   . Esophageal cancer Neg Hx   . Inflammatory bowel disease Neg Hx   . Liver disease Neg Hx   . Pancreatic cancer Neg Hx   . Stomach cancer Neg Hx   . Rectal cancer Neg Hx     ALLERGIES:  is allergic to banana.  MEDICATIONS:  Current Outpatient Medications  Medication Sig Dispense Refill  . Multiple Vitamin (MULTIVITAMIN) tablet Take 1 tablet by mouth daily.    Marland Kitchen omeprazole (PRILOSEC) 40 MG capsule Take 1 capsule (40 mg total) by mouth  daily. 120 capsule 3  . omeprazole (PRILOSEC) 40 MG capsule Take 1 capsule (40 mg total) by mouth daily. 90 capsule 3  . cholestyramine (QUESTRAN) 4 g packet Take 1 packet (4 g total) by mouth 2 (two) times daily. (Patient not taking: No sig reported) 60 each 12   No current facility-administered medications for this visit.    PHYSICAL EXAMINATION: ECOG PERFORMANCE STATUS: 0 - Asymptomatic  Vitals:   12/15/20 0905  BP: 119/86  Pulse: (!) 54  Resp: 20  Temp: (!) 97.2 F (36.2 C)  SpO2: 100%   Filed Weights   12/15/20 0905  Weight: 203 lb (92.1 kg)    Physical Exam Constitutional:      Appearance: Normal appearance.  HENT:     Head: Normocephalic and atraumatic.     Mouth/Throat:     Mouth: Mucous membranes are moist.  Eyes:     Extraocular Movements: Extraocular movements intact.     Pupils: Pupils are equal, round, and reactive to light.  Cardiovascular:  Rate and Rhythm: Normal rate and regular rhythm.     Pulses: Normal pulses.     Heart sounds: Normal heart sounds.  Pulmonary:     Effort: Pulmonary effort is normal.     Breath sounds: Normal breath sounds.  Abdominal:     General: Abdomen is flat. Bowel sounds are normal.     Palpations: Abdomen is soft.  Musculoskeletal:        General: No swelling or tenderness.     Cervical back: Normal range of motion and neck supple. No rigidity or tenderness.  Lymphadenopathy:     Cervical: No cervical adenopathy.  Skin:    General: Skin is warm and dry.     Coloration: Skin is not jaundiced or pale.  Neurological:     General: No focal deficit present.     Mental Status: He is alert and oriented to person, place, and time.  Psychiatric:        Mood and Affect: Mood normal.        Behavior: Behavior normal.     LABORATORY DATA:  I have reviewed the data as listed Lab Results  Component Value Date   WBC 5.5 10/08/2020   HGB 14.8 10/08/2020   HCT 44.5 10/08/2020   MCV 78.8 10/08/2020   PLT 276.0  10/08/2020     Chemistry      Component Value Date/Time   NA 140 09/07/2020 1120   K 3.8 09/07/2020 1120   CL 104 09/07/2020 1120   CO2 30 09/07/2020 1120   BUN 10 09/07/2020 1120   BUN 14 06/21/2019 0000   CREATININE 1.08 09/07/2020 1120   GLU 119 06/21/2019 0000      Component Value Date/Time   CALCIUM 9.2 09/07/2020 1120   ALKPHOS 62 09/07/2020 1120   AST 34 09/07/2020 1120   ALT 41 09/07/2020 1120   BILITOT 0.7 09/07/2020 1120     I have reviewed his prior labs and his most recent imaging.  RADIOGRAPHIC STUDIES: I have personally reviewed the radiological images as listed and agreed with the findings in the report. CT ABDOMEN PELVIS W CONTRAST  Result Date: 11/26/2020 CLINICAL DATA:  Abnormal splenic parenchyma follow-up. Left upper quadrant pain since gallbladder surgery. Loose more frequent bowel movement EXAM: CT ABDOMEN AND PELVIS WITH CONTRAST TECHNIQUE: Multidetector CT imaging of the abdomen and pelvis was performed using the standard protocol following bolus administration of intravenous contrast. CONTRAST:  139mL OMNIPAQUE IOHEXOL 300 MG/ML SOLN. PO contrast administered. COMPARISON:  CT abdomen pelvis 08/26/2020 FINDINGS: Lower chest: No acute abnormality. Hepatobiliary: No focal liver abnormality. Status post cholecystectomy. No biliary dilatation. Pancreas: No focal lesion. Normal pancreatic contour. No surrounding inflammatory changes. No main pancreatic ductal dilatation. Spleen: Heterogeneous splenic parenchyma. Adrenals/Urinary Tract: No adrenal nodule bilaterally. Bilateral kidneys enhance symmetrically. No hydronephrosis. No hydroureter. The urinary bladder is unremarkable. Stomach/Bowel: Stomach is within normal limits. No evidence of bowel wall thickening or dilatation. Appendix appears normal. Vascular/Lymphatic: The main portal, splenic, proximal superior mesenteric veins appear patent. No significant vascular findings are present. No enlarged abdominal or pelvic  lymph nodes. Reproductive: Prostate is unremarkable. Other: No intraperitoneal free fluid. No intraperitoneal free gas. No organized fluid collection. Musculoskeletal: No acute or significant osseous findings. IMPRESSION: 1. Persistently heterogeneous splenic parenchyma with no associated splenomegaly. Unclear etiology. Finding could be artifactual versus represent underlying disease. 2. Otherwise no acute intra-abdominal or intrapelvic abnormality. Electronically Signed   By: Iven Finn M.D.   On: 11/26/2020 23:27   I  have spent 20 minutes in the care of this patient including review of imaging, counseling, coordination of care with our surgical team   Benay Pike, MD 12/15/2020 12:24 PM

## 2020-12-15 NOTE — Telephone Encounter (Signed)
Spoke with patient to discuss radiology results. Per Dr Chryl Heck, spleen appears normal. Ok to cancel follow-up appt and disregard medical records release form. Patient instructed to call if any new issues arise.

## 2021-01-05 ENCOUNTER — Encounter: Payer: Self-pay | Admitting: Gastroenterology

## 2021-01-05 ENCOUNTER — Ambulatory Visit (INDEPENDENT_AMBULATORY_CARE_PROVIDER_SITE_OTHER): Payer: BC Managed Care – PPO | Admitting: Gastroenterology

## 2021-01-05 VITALS — BP 124/72 | HR 84 | Ht 68.0 in | Wt 198.0 lb

## 2021-01-05 DIAGNOSIS — K2100 Gastro-esophageal reflux disease with esophagitis, without bleeding: Secondary | ICD-10-CM

## 2021-01-05 DIAGNOSIS — K529 Noninfective gastroenteritis and colitis, unspecified: Secondary | ICD-10-CM

## 2021-01-05 DIAGNOSIS — D369 Benign neoplasm, unspecified site: Secondary | ICD-10-CM | POA: Diagnosis not present

## 2021-01-05 DIAGNOSIS — K209 Esophagitis, unspecified without bleeding: Secondary | ICD-10-CM

## 2021-01-05 DIAGNOSIS — K200 Eosinophilic esophagitis: Secondary | ICD-10-CM

## 2021-01-05 DIAGNOSIS — R140 Abdominal distension (gaseous): Secondary | ICD-10-CM

## 2021-01-05 NOTE — Patient Instructions (Signed)
You have been scheduled for an endoscopy. Please follow written instructions given to you at your visit today. If you use inhalers (even only as needed), please bring them with you on the day of your procedure.  Due to recent changes in healthcare laws, you may see the results of your imaging and laboratory studies on MyChart before your provider has had a chance to review them.  We understand that in some cases there may be results that are confusing or concerning to you. Not all laboratory results come back in the same time frame and the provider may be waiting for multiple results in order to interpret others.  Please give [place] 48 hours in order for your provider to thoroughly review all the results before contacting the office for clarification of your results.     If you are age 81 or younger, your body mass index should be between 19-25. Your Body mass index is 30.11 kg/m. If this is out of the aformentioned range listed, please consider follow up with your Primary Care Provider.   __________________________________________________________  The [facility] providers would like to encourage you to use [facility] to communicate with providers for non-urgent requests or questions.  Due to long hold times on the telephone, sending your provider a message by [facility] may be a faster and more efficient way to get a response.  Please allow 48 business hours for a response.  Please remember that this is for non-urgent requests.  . Thank you for choosing me and [facility].  Dr. Rush Landmark

## 2021-01-05 NOTE — Progress Notes (Addendum)
[place] VISIT   Primary Care Provider Ma Hillock, [place] [address] [address] [phone]  Patient Profile: Adam Dean is a 40 y.o. 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  The patient presents to the [facility] for an evaluation and management of problem(s) noted below:  Problem List 1. Gastroesophageal reflux disease with esophagitis without hemorrhage   2. Eosinophilic esophagitis   3. Squamous papilloma   4. Chronic diarrhea   5. Bloating symptom     History of Present Illness Please see initial consultation note for full details of HPI.  Interval History Patient is seen in follow-up.  EGD and colonoscopy results were discussed with him once again.  Patient feels he is not having as significant mount of loose bowel movements at this time.  He feels reflux symptoms have improved with omeprazole as has his diarrhea.  Abdominal discomfort has lessened as well.  He has not started cholestyramine.  He is willing to have follow-up endoscopy to evaluate the eosinophil count of his esophagus and follow-up of esophagitis.  Bloating symptom has improved.  Patient denies any blood in his stools.  GI Review of Systems Positive as above Negative for odynophagia, dysphagia, pyrosis, nausea, vomiting, pain, melena, hematochezia   Review of Systems General: Denies fevers/chills/weight loss unintentionally Cardiovascular: Denies chest pain/palpitations Pulmonary: Denies shortness of breath Gastroenterological: See HPI Genitourinary: Denies darkened urine Hematological: Denies easy bruising/bleeding Dermatological: Denies jaundice Psychological: Mood is stable   Medications Current Outpatient Medications  Medication Sig Dispense Refill  . Multiple Vitamin (MULTIVITAMIN) tablet  Take 1 tablet by mouth daily.    Marland Kitchen omeprazole (PRILOSEC) 40 MG capsule Take 1 capsule (40 mg total) by mouth daily. (Patient taking differently: Take 40 mg by mouth in the morning and at bedtime.) 90 capsule 3   No current facility-administered medications for this visit.    Allergies Allergies  Allergen Reactions  . Banana Diarrhea and Nausea And Vomiting    Histories Past Medical History:  Diagnosis Date  . Acute cholecystitis   . Chicken pox   . Gallstones 06/2019   Emergent cholecystectomy in [place].  . Migraine   . Spleen injury 2005   sports injury- "spleen leakage"   Past Surgical History:  Procedure Laterality Date  . CHOLECYSTECTOMY  06/21/2019  . INGUINAL HERNIA REPAIR     5-6 yrs old.    Social History   Socioeconomic History  . Marital status: Married    Spouse name: Not on file  . Number of children: Not on file  . Years of education: Not on file  . Highest education level: Not on file  Occupational History  . Not on file  Tobacco Use  . Smoking status: Never Smoker  . Smokeless tobacco: Never Used  Vaping Use  . Vaping Use: Never used  Substance and Sexual Activity  . Alcohol use: Never  . Drug use: Never  . Sexual activity: Yes    Partners: Female  Other Topics Concern  . Not on file  Social History Narrative   Marital status/children/pets: Married.   Education/employment: Bachelor's degree.   Safety:      -smoke alarm in the home:Yes     - wears seatbelt: Yes     - Feels safe in their relationships: Yes   Social Determinants of Health   Financial Resource Strain: Not on file  Food Insecurity: Not on file  Transportation Needs: Not on file  Physical Activity: Not on file  Stress: Not on file  Social Connections: Not on file  Intimate Partner Violence: Not on file   Family History  Problem Relation Age of Onset  . Hypertension Mother   . Birth defects Sister   . Heart attack Maternal Grandmother   . Heart attack Maternal  Grandfather   . Colon polyps Neg Hx   . Colon cancer Neg Hx   . Esophageal cancer Neg Hx   . Inflammatory bowel disease Neg Hx   . Liver disease Neg Hx   . Pancreatic cancer Neg Hx   . Stomach cancer Neg Hx   . Rectal cancer Neg Hx    I have reviewed his medical, social, and family history in detail and updated the electronic medical record as necessary.    PHYSICAL EXAMINATION  BP 124/72   Pulse 84   Ht 5\' 8"  (1.727 m)   Wt 198 lb (89.8 kg)   BMI 30.11 kg/m  Wt Readings from Last 3 Encounters:  01/05/21 198 lb (89.8 kg)  12/15/20 203 lb (92.1 kg)  11/17/20 205 lb (93 kg)  GEN: NAD, appears stated age, doesn't appear chronically ill PSYCH: Cooperative, without pressured speech EYE: Conjunctivae pink, sclerae anicteric ENT: Masked CV: RR without R/Gs  RESP: CTAB posteriorly, without wheezing GI: NABS, soft, tenderness to palpation lower abdomen bilaterally, nondistended, surgical scars present from prior laparoscopy, without rebound or guarding, no HSM appreciated MSK/EXT: No lower extremity edema SKIN: No jaundice NEURO:  Alert & Oriented x 3, no focal deficits   REVIEW OF DATA  I reviewed the following data at the time of this encounter:  GI Procedures and Studies  April 2022 EGD - Esophageal mucosal changes suspicious for eosinophilic esophagitis. Biopsied. - LA Grade C reflux esophagitis with no bleeding at distal esophagus/GEJxn. Biopsied. - Z-line irregular, 40 cm from the incisors. - 1 cm hiatal hernia. - Erythematous mucosa in the gastric body and antrum. No other gross lesions in the stomach. Biopsied. - No gross lesions in the duodenal bulb, in the first portion of the duodenum and in the second portion of the duodenum. Biopsied.  April 2022 colonoscopy - Hemorrhoids found on digital rectal exam. - The examined portion of the ileum was normal. Biopsied. - Normal mucosa in the entire examined colon. Biopsied. - Polypoid lesion at the dentate line.  Biopsied. - Non-bleeding non-thrombosed internal hemorrhoids.  Pathology Diagnosis 1. Surgical [P], duodenal - BENIGN DUODENAL MUCOSA - NO ACUTE INFLAMMATION, VILLOUS BLUNTING OR INCREASED INTRAEPITHELIAL LYMPHOCYTES IDENTIFIED 2. Surgical [P], gastric - MILD CHRONIC GASTRITIS WITHOUT ACTIVITY - NO H. PYLORI OR INTESTINAL METAPLASIA IDENTIFIED - SEE COMMENT 3. Surgical [P], distal esophagus - SQUAMOUS MUCOSA WITH INCREASED INTRAEPITHELIAL EOSINOPHILS (UP TO 50/HIGH POWER FIELD - SEE COMMENT 4. Surgical [P], random esophagus - SQUAMOUS MUCOSA WITH INCREASED INTRAEPITHELIAL EOSINOPHILS (UP TO 30/HIGH POWER FIELD - SEE COMMENT 5. Surgical [P], small bowel - BENIGN SMALL BOWEL MUCOSA WITH LYMPHOID AGGREGATES - NO ACUTE INFLAMMATION, GRANULOMAS OR MALIGNANCY IDENTIFIED 6. Surgical [P], random colon sites - BENIGN COLONIC MUCOSA - NO ACTIVE INFLAMMATION OR EVIDENCE OF MICROSCOPIC COLITIS - NO HIGH GRADE DYSPLASIA OR MALIGNANCY IDENTIFIED 7. Surgical [P], colon, anal polyp, polyp (1) - SQUAMOUS PAPILLOMA, INFLAMED  Laboratory Studies  Reviewed those in epic  Imaging Studies  No new imaging studies to review   ASSESSMENT  Mr. Yannuzzi is a 40 y.o. 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  The patient is seen today for evaluation and management of:  1. Gastroesophageal reflux disease with esophagitis without hemorrhage   2. Eosinophilic esophagitis   3. Squamous papilloma   4. Chronic diarrhea   5. Bloating symptom    The patient is clinically and hemodynamically stable.  He is doing well on PPI therapy.  He has esophageal eosinophilia with possible eosinophilic esophagitis however without overt dysphagia.  GERD with esophagitis was present thus the possibility of GERD related esophageal eosinophilia remains.  This point would continue his PPI  therapy with plan for repeat endoscopy to ensure healing and reevaluate esophageal eosinophilia in a few months.  Patient's bloating symptom has improved while on PPI therapy.  Patient also having improvement in his bowel movements so we will see how he does but I still think cholestyramine is a good option for him in the possibility of a bile salt diarrhea in the future if he has issues as he does not have inflammatory bowel disease.  Patient has plan for colorectal surgery evaluation in the next few days for follow-up/evaluation of anal squamous papilloma that was removed at time of last procedure but for surveillance indications and potential a exam under anesthesia at some point.  All patient questions were answered to the best of my ability, and the patient agrees to the aforementioned plan of action with follow-up as indicated.   PLAN  May continue fiber once to twice daily to bulk stool Continue omeprazole 40 mg twice daily Repeat EGD in 3 months with esophageal biopsies to be reobtained and follow-up esophagitis Follow-up colonoscopy due in 10 years unless colorectal surgery feels earlier it is indicated based on the findings of squamous papilloma first patient undergoing exam under anesthesia follow-up May consider cholestyramine in future depending on how patient does SIBO breath testing can be considered in future if bloating symptom recurs   Orders Placed This Encounter  Procedures  . Ambulatory referral to Gastroenterology    New Prescriptions   No medications on file   Modified Medications   No medications on file    Planned Follow Up No follow-ups on file.   Total Time in Face-to-Face and in Coordination of Care for patient including independent/personal interpretation/review of prior testing, medical history, examination, medication adjustment, communicating results with the patient directly, and documentation with the EHR is 25 minutes.   Justice Britain,  MD [facility] Office # [phone]

## 2021-01-07 ENCOUNTER — Encounter: Payer: Self-pay | Admitting: Gastroenterology

## 2021-01-11 ENCOUNTER — Encounter: Payer: Self-pay | Admitting: Family Medicine

## 2021-01-11 DIAGNOSIS — D369 Benign neoplasm, unspecified site: Secondary | ICD-10-CM | POA: Insufficient documentation

## 2021-01-11 DIAGNOSIS — K200 Eosinophilic esophagitis: Secondary | ICD-10-CM | POA: Insufficient documentation

## 2021-01-11 DIAGNOSIS — K2100 Gastro-esophageal reflux disease with esophagitis, without bleeding: Secondary | ICD-10-CM | POA: Insufficient documentation

## 2021-03-18 ENCOUNTER — Ambulatory Visit (AMBULATORY_SURGERY_CENTER): Payer: BC Managed Care – PPO | Admitting: Gastroenterology

## 2021-03-18 ENCOUNTER — Encounter: Payer: BC Managed Care – PPO | Admitting: Gastroenterology

## 2021-03-18 ENCOUNTER — Encounter: Payer: Self-pay | Admitting: Gastroenterology

## 2021-03-18 ENCOUNTER — Other Ambulatory Visit: Payer: Self-pay

## 2021-03-18 VITALS — BP 111/76 | HR 65 | Temp 97.8°F | Resp 13 | Ht 68.0 in | Wt 198.0 lb

## 2021-03-18 DIAGNOSIS — K200 Eosinophilic esophagitis: Secondary | ICD-10-CM

## 2021-03-18 DIAGNOSIS — K2100 Gastro-esophageal reflux disease with esophagitis, without bleeding: Secondary | ICD-10-CM

## 2021-03-18 DIAGNOSIS — K449 Diaphragmatic hernia without obstruction or gangrene: Secondary | ICD-10-CM

## 2021-03-18 DIAGNOSIS — K2289 Other specified disease of esophagus: Secondary | ICD-10-CM | POA: Diagnosis not present

## 2021-03-18 HISTORY — PX: ESOPHAGOGASTRODUODENOSCOPY: SHX1529

## 2021-03-18 MED ORDER — SODIUM CHLORIDE 0.9 % IV SOLN: 500.0000 mL | Freq: Once | INTRAVENOUS | Status: DC

## 2021-03-18 NOTE — Progress Notes (Signed)
Pt in recovery with monitors in place, VSS. Report given to receiving RN. Bite guard was placed with pt awake to ensure comfort. No dental or soft tissue damage noted. 

## 2021-03-18 NOTE — Patient Instructions (Addendum)
Please read handouts provided. May decrease Prilosec ( omeprazole ) 40 mg to once daily. Await pathology results. Repeat upper endoscopy.   YOU HAD AN ENDOSCOPIC PROCEDURE TODAY AT [facility]:   Refer to the procedure report that was given to you for any specific questions about what was found during the examination.  If the procedure report does not answer your questions, please call your gastroenterologist to clarify.  If you requested that your care partner not be given the details of your procedure findings, then the procedure report has been included in a sealed envelope for you to review at your convenience later.  YOU SHOULD EXPECT: Some feelings of bloating in the abdomen. Passage of more gas than usual.  Walking can help get rid of the air that was put into your GI tract during the procedure and reduce the bloating. If you had a lower endoscopy (such as a colonoscopy or flexible sigmoidoscopy) you may notice spotting of blood in your stool or on the toilet paper. If you underwent a bowel prep for your procedure, you may not have a normal bowel movement for a few days.  Please Note:  You might notice some irritation and congestion in your nose or some drainage.  This is from the oxygen used during your procedure.  There is no need for concern and it should clear up in a day or so.  SYMPTOMS TO REPORT IMMEDIATELY:   Following upper endoscopy (EGD)  Vomiting of blood or coffee ground material  New chest pain or pain under the shoulder blades  Painful or persistently difficult swallowing  New shortness of breath  Fever of 100F or higher  Black, tarry-looking stools  For urgent or emergent issues, a gastroenterologist can be reached at any hour by calling [phone]. Do not use MyChart messaging for urgent concerns.    DIET:  We do recommend a small meal at first, but then you may proceed to your regular diet.  Drink plenty of fluids but you should avoid  alcoholic beverages for 24 hours.  ACTIVITY:  You should plan to take it easy for the rest of today and you should NOT DRIVE or use heavy machinery until tomorrow (because of the sedation medicines used during the test).    FOLLOW UP: Our staff will call the number listed on your records 48-72 hours following your procedure to check on you and address any questions or concerns that you may have regarding the information given to you following your procedure. If we do not reach you, we will leave a message.  We will attempt to reach you two times.  During this call, we will ask if you have developed any symptoms of COVID 19. If you develop any symptoms (ie: fever, flu-like symptoms, shortness of breath, cough etc.) before then, please call [phone].  If you test positive for Covid 19 in the 2 weeks post procedure, please call and report this information to [place].    If any biopsies were taken you will be contacted by phone or by letter within the next 1-3 weeks.  Please call us at [phone] if you have not heard about the biopsies in 3 weeks.    SIGNATURES/CONFIDENTIALITY: You and/or your care partner have signed paperwork which will be entered into your electronic medical record.  These signatures attest to the fact that that the information above on your After Visit Summary has been reviewed and is understood.  Full responsibility of the confidentiality of  this discharge information lies with you and/or your care-partner.

## 2021-03-18 NOTE — Progress Notes (Signed)
GASTROENTEROLOGY PROCEDURE H&P NOTE   Primary Care Physician: Ma Hillock, DO  HPI: Adam Dean is a 40 y.o. male who presents for EGD for follow up esophageal eosinophilia and esophagitis.  Past Medical History:  Diagnosis Date   Acute cholecystitis    Chicken pox    Gallstones 06/2019   Emergent cholecystectomy in [place].   Gastritis determined by endoscopy 2022   mild, negative H. Pylori   Migraine    Spleen injury 2005   sports injury- "spleen leakage"   Squamous papilloma 2022   Polypoid lesion at the dentate line> bx> Sq. pap.    Past Surgical History:  Procedure Laterality Date   CHOLECYSTECTOMY  06/21/2019   COLONOSCOPY WITH ESOPHAGOGASTRODUODENOSCOPY (EGD)  11/17/2020   mild gastritis. H. pylori negative./ rectal polyp>sq. papilloma.    INGUINAL HERNIA REPAIR     5-6 yrs old.    Current Outpatient Medications  Medication Sig Dispense Refill   omeprazole (PRILOSEC) 40 MG capsule Take 1 capsule (40 mg total) by mouth daily. (Patient taking differently: Take 40 mg by mouth in the morning and at bedtime.) 90 capsule 3   Multiple Vitamin (MULTIVITAMIN) tablet Take 1 tablet by mouth daily.     Current Facility-Administered Medications  Medication Dose Route Frequency Provider Last Rate Last Admin   0.9 %  sodium chloride infusion  500 mL Intravenous Once Mansouraty, Telford Nab., MD        Current Outpatient Medications:    omeprazole (PRILOSEC) 40 MG capsule, Take 1 capsule (40 mg total) by mouth daily. (Patient taking differently: Take 40 mg by mouth in the morning and at bedtime.), Disp: 90 capsule, Rfl: 3   Multiple Vitamin (MULTIVITAMIN) tablet, Take 1 tablet by mouth daily., Disp: , Rfl:   Current Facility-Administered Medications:    0.9 %  sodium chloride infusion, 500 mL, Intravenous, Once, Mansouraty, Telford Nab., MD Allergies  Allergen Reactions   Banana Diarrhea and Nausea And Vomiting   Family History  Problem Relation Age of Onset    Hypertension Mother    Birth defects Sister    Heart attack Maternal Grandmother    Heart attack Maternal Grandfather    Colon polyps Neg Hx    Colon cancer Neg Hx    Esophageal cancer Neg Hx    Inflammatory bowel disease Neg Hx    Liver disease Neg Hx    Pancreatic cancer Neg Hx    Stomach cancer Neg Hx    Rectal cancer Neg Hx    Social History   Socioeconomic History   Marital status: Married    Spouse name: Not on file   Number of children: Not on file   Years of education: Not on file   Highest education level: Not on file  Occupational History   Not on file  Tobacco Use   Smoking status: Never   Smokeless tobacco: Never  Vaping Use   Vaping Use: Never used  Substance and Sexual Activity   Alcohol use: Never   Drug use: Never   Sexual activity: Yes    Partners: Female  Other Topics Concern   Not on file  Social History Narrative   Marital status/children/pets: Married.   Education/employment: Bachelor's degree.   Safety:      -smoke alarm in the home:Yes     - wears seatbelt: Yes     - Feels safe in their relationships: Yes   Social Determinants of Health   Financial Resource Strain: Not on file  Food Insecurity: Not on file  Transportation Needs: Not on file  Physical Activity: Not on file  Stress: Not on file  Social Connections: Not on file  Intimate Partner Violence: Not on file    Physical Exam: Vital signs in last 24 hours: '@VSRANGES'$ @   GEN: NAD EYE: Sclerae anicteric ENT: MMM CV: Non-tachycardic GI: Soft, NT/ND NEURO:  Alert & Oriented x 3  Lab Results: No results for input(s): WBC, HGB, HCT, PLT in the last 72 hours. BMET No results for input(s): NA, K, CL, CO2, GLUCOSE, BUN, CREATININE, CALCIUM in the last 72 hours. LFT No results for input(s): PROT, ALBUMIN, AST, ALT, ALKPHOS, BILITOT, BILIDIR, IBILI in the last 72 hours. PT/INR No results for input(s): LABPROT, INR in the last 72 hours.   Impression / Plan: This is a 40  y.o.male who presents for EGD for follow up esophageal eosinophilia and esophagitis.  The risks and benefits of endoscopic evaluation/treatment were discussed with the patient and/or family; these include but are not limited to the risk of perforation, infection, bleeding, missed lesions, lack of diagnosis, severe illness requiring hospitalization, as well as anesthesia and sedation related illnesses.  The patient's history has been reviewed, patient examined, no change in status, and deemed stable for procedure.  The patient and/or family is agreeable to proceed.    Justice Britain, MD [facility] Office # [phone]

## 2021-03-18 NOTE — Progress Notes (Signed)
Called to room to assist during endoscopic procedure.  Patient ID and intended procedure confirmed with present staff. Received instructions for my participation in the procedure from the performing physician.  

## 2021-03-18 NOTE — Op Note (Signed)
[facility] Patient Name: Adam Dean Daily Procedure Date: 03/18/2021 7:54 AM MRN: [id number] Endoscopist: Justice Britain , MD Age: 40 Referring MD:  Date of Birth: Sep 10, 1980 Gender: Male Account #: [id number] Procedure:                Upper GI endoscopy Indications:              Heartburn, Eosinophilic esophagitis, Follow-up of                            eosinophilic esophagitis, Follow-up of esophagitis Medicines:                Monitored Anesthesia Care Procedure:                Pre-Anesthesia Assessment:                           - Prior to the procedure, a History and Physical                            was performed, and patient medications and                            allergies were reviewed. The patient's tolerance of                            previous anesthesia was also reviewed. The risks                            and benefits of the procedure and the sedation                            options and risks were discussed with the patient.                            All questions were answered, and informed consent                            was obtained. Prior Anticoagulants: The patient has                            taken no previous anticoagulant or antiplatelet                            agents. ASA Grade Assessment: II - A patient with                            mild systemic disease. After reviewing the risks                            and benefits, the patient was deemed in                            satisfactory condition to undergo the procedure.  After obtaining informed consent, the endoscope was                            passed under direct vision. Throughout the                            procedure, the patient's blood pressure, pulse, and                            oxygen saturations were monitored continuously. The                            GIF [id number] #[id number] was introduced through the                            mouth,  and advanced to the second part of duodenum.                            The upper GI endoscopy was accomplished without                            difficulty. The patient tolerated the procedure. Scope In: Scope Out: Findings:                 Mucosal changes including circumferential folds and                            white specks were found in the entire esophagus.                            Esophageal findings were graded using the                            Eosinophilic Esophagitis Endoscopic Reference Score                            (EoE-EREFS) as: Edema Grade 0 Normal (distinct                            vascular markings), Rings Grade 1 Mild (subtle                            circumferential ridges seen on esophageal                            distension), Exudates Grade 1 Mild (scattered white                            lesions involving less than 10 percent of the                            esophageal surface area), Furrows Grade 0 None (no  vertical lines seen) and Stricture none (no                            stricture found). Biopsies were obtained from the                            distal esophagus with cold forceps for histology of                            suspected esophageal eosinophilia. Biopsies were                            obtained from the proximal and middle esophagus                            with cold forceps for histology of suspected                            eosinophilic esophagitis.                           There is no endoscopic evidence of erosive                            esophagitis in the entire esophagus (this has                            healed with PPI therapy).                           The Z-line was irregular and was found 38 cm from                            the incisors.                           A 1 cm hiatal hernia was present.                           Patchy mildly erythematous mucosa without bleeding                             was found in the gastric antrum.                           No other gross lesions were noted in the entire                            examined stomach (previous biopsies had been                            obtained).                           No gross lesions were noted in the duodenal bulb,  in the first portion of the duodenum and in the                            second portion of the duodenum. Complications:            No immediate complications. Estimated Blood Loss:     Estimated blood loss was minimal. Impression:               - Esophageal mucosal changes consistent with                            esophageal eosinophilia. Biopsied Proximal/Middle                            and Distal.                           - Previous esophagitis has healed.                           - Z-line irregular, 38 cm from the incisors.                           - 1 cm hiatal hernia.                           - Erythematous mucosa in the antrum. No other gross                            lesions in the stomach - previously biopsied.                           - No gross lesions in the duodenal bulb, in the                            first portion of the duodenum and in the second                            portion of the duodenum. Recommendation:           - The patient will be observed post-procedure,                            until all discharge criteria are met.                           - Discharge patient to home.                           - Patient has a contact number available for                            emergencies. The signs and symptoms of potential                            delayed complications were discussed with  the                            patient. Return to normal activities tomorrow.                            Written discharge instructions were provided to the                            patient.                           - Resume  previous diet.                           - May decrease PPI to once daily.                           - Observe patient's clinical course.                           - Await pathology results.                           - Repeat upper endoscopy to be based on patient                            symptoms and overall eosinophil count on pathology                            to see what response has occured with high-dose PPI                            therapy.                           - The findings and recommendations were discussed                            with the patient. Justice Britain, MD 03/18/2021 8:15:11 AM

## 2021-03-18 NOTE — Progress Notes (Signed)
Pt's states no medical or surgical changes since previsit or office visit. 

## 2021-03-22 ENCOUNTER — Telehealth: Payer: Self-pay

## 2021-03-22 NOTE — Telephone Encounter (Signed)
Left message on answering machine. 

## 2021-03-23 ENCOUNTER — Encounter: Payer: Self-pay | Admitting: Gastroenterology

## 2021-03-29 ENCOUNTER — Encounter (HOSPITAL_BASED_OUTPATIENT_CLINIC_OR_DEPARTMENT_OTHER): Payer: Self-pay | Admitting: Surgery

## 2021-03-29 ENCOUNTER — Other Ambulatory Visit: Payer: Self-pay

## 2021-03-29 NOTE — Progress Notes (Signed)
Spoke w/ via phone for pre-op interview--- Pt Lab needs dos---- no (per anes)/  pre-op orders pending           Lab results------ no COVID test -----patient states asymptomatic no test needed Arrive at ------- 0530 on 04-01-2021 NPO after MN NO Solid Food.  Clear liquids from MN until--- 0430 Med rec completed Medications to take morning of surgery ----- NONE Diabetic medication ----- n/a Patient instructed no nail polish to be worn day of surgery Patient instructed to bring photo id and insurance card day of surgery Patient aware to have Driver (ride ) / caregiver for 24 hours after surgery --wife, Rayen Siri Patient Special Instructions ----- n/a Pre-Op special Istructions ----- sent inbox message to dr white in epic, requested orders Patient verbalized understanding of instructions that were given at this phone interview. Patient denies shortness of breath, chest pain, fever, cough at this phone interview.

## 2021-03-30 ENCOUNTER — Ambulatory Visit: Payer: Self-pay | Admitting: Surgery

## 2021-03-30 NOTE — H&P (Signed)
CC: Referred by Dr. Rush Dean for newly diagnosed anal canal lesion  HPI: Mr. Adam Dean is a very pleasant 71yoM with no prior medical history presents to our office for evaluation of an anal canal polypoid lesion found during colonoscopy.  He reports that approximately 2 years ago he had a laparoscopic cholecystectomy performed in [place] area and since that time he had had some vague left-sided abdominal discomforts and pains.  He also had associated diarrhea that has been somewhat chronic now.  He underwent upper and lower endoscopy with Dr. Rush Dean.  He has been started on a PPI for some esophagitis.  His diarrhea has somewhat improved.  Incidentally, he was found to have a small nodule in the anal canal.  As is biopsied and returned as a potential squamous papilloma.  He was referred to our office.  He denies any complaints today.  He denies any history of anorectal pain or bleeding.  He denies any history of prolapse, hemorrhoids, or anal fissures.  PMH: Denies  PSH: Laparoscopic cholecystectomy in 2020, [place].  FHx: Denies FHx of colorectal, breast, endometrial, ovarian or cervical cancer  Social: Denies use of tobacco/EtOH/drugs. He is self employed as a Cabin crew  ROS: A comprehensive 10 system review of systems was completed with the patient and pertinent findings as noted above.  The patient is a 40 year old male.   Past Surgical History Adam Forehand, CNA; 01/06/2021 9:29 AM) Colon Polyp Removal - Colonoscopy   Gallbladder Surgery - Laparoscopic   Vasectomy    Diagnostic Studies History Adam Forehand, CNA; 01/06/2021 9:29 AM) Colonoscopy   within last year  Allergies Adam Forehand, CNA; 01/06/2021 9:29 AM) No Known Drug Allergies   [01/06/2021]: Allergies Reconciled    Medication History Adam Forehand, CNA; 01/06/2021 9:30 AM) Cholestyramine  (4GM Packet, Oral) Active. Omeprazole  ('40MG'$  Capsule DR, Oral) Active. Plenvu  (140GM For Solution, Oral)  Active. Medications Reconciled   Social History Adam Forehand, CNA; 01/06/2021 9:29 AM) No alcohol use   No caffeine use   No drug use   Tobacco use   Never smoker.  Family History Adam Forehand, CNA; 01/06/2021 9:29 AM) Hypertension   Mother.  Other Problems Adam Forehand, CNA; 01/06/2021 9:29 AM) Back Pain   Cholelithiasis   Hemorrhoids   Inguinal Hernia   Ventral Hernia Repair      Review of Systems Adam Reams Alston CNA; 01/06/2021 9:29 AM) General Not Present- Appetite Loss, Chills, Fatigue, Fever, Night Sweats, Weight Gain and Weight Loss. Skin Not Present- Change in Wart/Mole, Dryness, Hives, Jaundice, New Lesions, Non-Healing Wounds, Rash and Ulcer. HEENT Not Present- Earache, Hearing Loss, Hoarseness, Nose Bleed, Oral Ulcers, Ringing in the Ears, Seasonal Allergies, Sinus Pain, Sore Throat, Visual Disturbances, Wears glasses/contact lenses and Yellow Eyes. Respiratory Not Present- Bloody sputum, Chronic Cough, Difficulty Breathing, Snoring and Wheezing. Breast Not Present- Breast Mass, Breast Pain, Nipple Discharge and Skin Changes. Cardiovascular Not Present- Chest Pain, Difficulty Breathing Lying Down, Leg Cramps, Palpitations, Rapid Heart Rate, Shortness of Breath and Swelling of Extremities. Gastrointestinal Present- Abdominal Pain and Change in Bowel Habits. Not Present- Bloating, Bloody Stool, Chronic diarrhea, Constipation, Difficulty Swallowing, Excessive gas, Gets full quickly at meals, Hemorrhoids, Indigestion, Nausea, Rectal Pain and Vomiting. Male Genitourinary Not Present- Blood in Urine, Change in Urinary Stream, Frequency, Impotence, Nocturia, Painful Urination, Urgency and Urine Leakage. Musculoskeletal Present- Back Pain. Not Present- Joint Pain, Joint Stiffness, Muscle Pain, Muscle Weakness and Swelling of Extremities. Neurological Not Present- Decreased Memory, Fainting, Headaches, Numbness, Seizures, Tingling,  Tremor, Trouble walking and  Weakness. Psychiatric Not Present- Anxiety, Bipolar, Change in Sleep Pattern, Depression, Fearful and Frequent crying. Endocrine Not Present- Cold Intolerance, Excessive Hunger, Hair Changes, Heat Intolerance, Hot flashes and New Diabetes. Hematology Not Present- Blood Thinners, Easy Bruising, Excessive bleeding, Gland problems, HIV and Persistent Infections.  Vitals (Adam Alston CNA; 01/06/2021 9:30 AM) 01/06/2021 9:30 AM Weight: 198.38 lb   Height: 68 in  Body Surface Area: 2.04 m   Body Mass Index: 30.16 kg/m   Temp.: 97.9 F    Pulse: 98 (Regular)    P.OX: 97% (Room air) BP: 140/96(Sitting, Left Arm, Standard)       Physical Exam Adam Gave M. Snyder Colavito MD; 01/06/2021 10:26 AM) The physical exam findings are as follows: Note:  Constitutional: No acute distress; conversant; no deformities Eyes: Moist conjunctiva; no lid lag; anicteric sclerae; pupils equal round and reactive to light Neck: Trachea midline; no palpable thyromegaly Lungs: Normal respiratory effort; no tactile fremitus CV: Regular rate and rhythm; no palpable thrill; no pitting edema GI: Abdomen soft, nontender, nondistended; no palpable hepatosplenomegaly Anorectal: Normal perianal skin; DRE - increased tone, no palpable masses Anoscopy: Circumferential anoscopy demonstrates the beginnings of this nodule in the anal canal.  Somewhat limited in extent of exam due to increased tone.  Normal-appearing anoderm with ulceration, fissures, or granulation tissue. MSK: Normal gait; no clubbing/cyanosis Psychiatric: Appropriate affect; alert and oriented 3 Lymphatic: No palpable cervical or axillary lymphadenopathy **A chaperone, Adam Dean CMA, was present for this encounter    Assessment & Plan Adam Gave M. 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 Seeing Dr. Chryl Dean. She was asking radiology about other tests if necessary; it's likely with his history this is perfusion abnormalities related to his stated splenic injury in the past.  -The anatomy and physiology of the anal canal was discussed at length with the patient. The pathophysiology of anal canal papillomas as well as other potential things such as condyloma or AIN were reviewed with associated pictures and illustrations. -We have reviewed observation vs excision - with excision providing pathologic evaluation to ensure this is just a 'squamous papilloma' which are rare things to find in the anal canal. He would prefer removal which I agree would help provide further clarification on the findings -The planned procedure, material risks (including, but not limited to, pain, bleeding, infection, scarring, need for additional procedures, recurrence, pneumonia, heart attack, stroke, death) benefits and alternatives to surgery were discussed at length. The patient's questions were answered to his satisfaction, he voiced understanding and elected to proceed with surgery. Additionally, we discussed typical postoperative expectations and the recovery process.  This patient encounter took 35 minutes today to perform the following: take history, perform exam, review outside records, interpret imaging, counsel the patient on their diagnosis and document encounter, findings & plan in the EHR Current Plans ANOSCOPY, DIAGNOSTIC [phone]) (Anoscopy: Circumferential anoscopy demonstrates the beginnings of this nodule in the anal canal. Somewhat limited in extent of exam due to increased tone. Normal-appearing anoderm with ulceration, fissures, or granulation tissue.) Signed by Ileana Roup, MD (01/06/2021 10:27 AM)

## 2021-04-01 ENCOUNTER — Encounter (HOSPITAL_BASED_OUTPATIENT_CLINIC_OR_DEPARTMENT_OTHER)
Admission: RE | Disposition: A | Discharge status: Home / Self Care | Payer: Self-pay | Source: Home / Self Care | Attending: Surgery

## 2021-04-01 ENCOUNTER — Encounter (HOSPITAL_BASED_OUTPATIENT_CLINIC_OR_DEPARTMENT_OTHER): Payer: Self-pay | Admitting: Surgery

## 2021-04-01 ENCOUNTER — Ambulatory Visit (HOSPITAL_BASED_OUTPATIENT_CLINIC_OR_DEPARTMENT_OTHER): Payer: BC Managed Care – PPO | Admitting: Anesthesiology

## 2021-04-01 ENCOUNTER — Ambulatory Visit (HOSPITAL_BASED_OUTPATIENT_CLINIC_OR_DEPARTMENT_OTHER)
Admission: RE | Admit: 2021-04-01 | Discharge: 2021-04-01 | Disposition: A | Discharge status: Home / Self Care | Payer: BC Managed Care – PPO | Attending: Surgery | Admitting: Surgery

## 2021-04-01 ENCOUNTER — Other Ambulatory Visit: Payer: Self-pay

## 2021-04-01 DIAGNOSIS — K629 Disease of anus and rectum, unspecified: Secondary | ICD-10-CM | POA: Diagnosis present

## 2021-04-01 DIAGNOSIS — K620 Anal polyp: Secondary | ICD-10-CM | POA: Diagnosis not present

## 2021-04-01 DIAGNOSIS — K644 Residual hemorrhoidal skin tags: Secondary | ICD-10-CM | POA: Insufficient documentation

## 2021-04-01 DIAGNOSIS — Z91018 Allergy to other foods: Secondary | ICD-10-CM | POA: Diagnosis not present

## 2021-04-01 HISTORY — DX: Eosinophilic esophagitis: K20.0

## 2021-04-01 HISTORY — DX: Presence of spectacles and contact lenses: Z97.3

## 2021-04-01 HISTORY — DX: Diaphragmatic hernia without obstruction or gangrene: K44.9

## 2021-04-01 HISTORY — PX: TRANSANAL EXCISION OF RECTAL MASS: SHX6134

## 2021-04-01 SURGERY — EXCISION, MASS, RECTUM, ANAL APPROACH
Anesthesia: General | Site: Anus

## 2021-04-01 MED ORDER — PROPOFOL 10 MG/ML IV BOLUS
INTRAVENOUS | Status: AC
  Filled 2021-04-01: qty 40

## 2021-04-01 MED ORDER — FENTANYL CITRATE (PF) 250 MCG/5ML IJ SOLN
INTRAMUSCULAR | Status: AC
  Filled 2021-04-01: qty 5

## 2021-04-01 MED ORDER — FENTANYL CITRATE (PF) 100 MCG/2ML IJ SOLN: 25.0000 ug | INTRAMUSCULAR | Status: DC | PRN

## 2021-04-01 MED ORDER — DEXAMETHASONE SODIUM PHOSPHATE 10 MG/ML IJ SOLN
INTRAMUSCULAR | Status: DC | PRN
  Administered 2021-04-01: 10 mg via INTRAVENOUS

## 2021-04-01 MED ORDER — ROCURONIUM BROMIDE 10 MG/ML (PF) SYRINGE
PREFILLED_SYRINGE | INTRAVENOUS | Status: DC | PRN
  Administered 2021-04-01: 50 mg via INTRAVENOUS

## 2021-04-01 MED ORDER — FLEET ENEMA 7-19 GM/118ML RE ENEM: 1.0000 | ENEMA | Freq: Once | RECTAL | Status: DC

## 2021-04-01 MED ORDER — PROMETHAZINE HCL 25 MG/ML IJ SOLN: 6.2500 mg | INTRAMUSCULAR | Status: DC | PRN

## 2021-04-01 MED ORDER — ACETAMINOPHEN 500 MG PO TABS
1000.0000 mg | ORAL_TABLET | ORAL | Status: AC
  Administered 2021-04-01: 1000 mg via ORAL

## 2021-04-01 MED ORDER — DIBUCAINE (PERIANAL) 1 % EX OINT
TOPICAL_OINTMENT | CUTANEOUS | Status: DC | PRN
  Administered 2021-04-01: 1 via RECTAL

## 2021-04-01 MED ORDER — ROCURONIUM BROMIDE 10 MG/ML (PF) SYRINGE
PREFILLED_SYRINGE | INTRAVENOUS | Status: AC
  Filled 2021-04-01: qty 10

## 2021-04-01 MED ORDER — FENTANYL CITRATE (PF) 250 MCG/5ML IJ SOLN
INTRAMUSCULAR | Status: DC | PRN
  Administered 2021-04-01: 100 ug via INTRAVENOUS
  Administered 2021-04-01: 50 ug via INTRAVENOUS

## 2021-04-01 MED ORDER — LIDOCAINE HCL (PF) 2 % IJ SOLN
INTRAMUSCULAR | Status: AC
  Filled 2021-04-01: qty 5

## 2021-04-01 MED ORDER — PROPOFOL 10 MG/ML IV BOLUS
INTRAVENOUS | Status: DC | PRN
  Administered 2021-04-01: 170 mg via INTRAVENOUS

## 2021-04-01 MED ORDER — BUPIVACAINE-EPINEPHRINE 0.5% -1:200000 IJ SOLN
INTRAMUSCULAR | Status: DC | PRN
  Administered 2021-04-01: 17 mL

## 2021-04-01 MED ORDER — LIDOCAINE 2% (20 MG/ML) 5 ML SYRINGE
INTRAMUSCULAR | Status: DC | PRN
  Administered 2021-04-01: 80 mg via INTRAVENOUS

## 2021-04-01 MED ORDER — ONDANSETRON HCL 4 MG/2ML IJ SOLN
INTRAMUSCULAR | Status: DC | PRN
Start: 2021-04-01 — End: 2021-04-01
  Administered 2021-04-01: 4 mg via INTRAVENOUS

## 2021-04-01 MED ORDER — OXYCODONE HCL 5 MG/5ML PO SOLN
5.0000 mg | Freq: Once | ORAL | Status: DC | PRN
Start: 2021-04-01 — End: 2021-04-01

## 2021-04-01 MED ORDER — KETOROLAC TROMETHAMINE 30 MG/ML IJ SOLN: 30.0000 mg | Freq: Once | INTRAMUSCULAR | Status: DC | PRN

## 2021-04-01 MED ORDER — ACETAMINOPHEN 500 MG PO TABS
ORAL_TABLET | ORAL | Status: AC
  Filled 2021-04-01: qty 2

## 2021-04-01 MED ORDER — BUPIVACAINE LIPOSOME 1.3 % IJ SUSP
INTRAMUSCULAR | Status: DC | PRN
  Administered 2021-04-01: 17 mL

## 2021-04-01 MED ORDER — LACTATED RINGERS IV SOLN: INTRAVENOUS | Status: DC

## 2021-04-01 MED ORDER — MIDAZOLAM HCL 2 MG/2ML IJ SOLN
INTRAMUSCULAR | Status: AC
  Filled 2021-04-01: qty 2

## 2021-04-01 MED ORDER — ONDANSETRON HCL 4 MG/2ML IJ SOLN
INTRAMUSCULAR | Status: AC
  Filled 2021-04-01: qty 2

## 2021-04-01 MED ORDER — BUPIVACAINE LIPOSOME 1.3 % IJ SUSP: 20.0000 mL | Freq: Once | INTRAMUSCULAR | Status: DC

## 2021-04-01 MED ORDER — DEXAMETHASONE SODIUM PHOSPHATE 10 MG/ML IJ SOLN
INTRAMUSCULAR | Status: AC
  Filled 2021-04-01: qty 1

## 2021-04-01 MED ORDER — SUGAMMADEX SODIUM 200 MG/2ML IV SOLN
INTRAVENOUS | Status: DC | PRN
  Administered 2021-04-01: 200 mg via INTRAVENOUS

## 2021-04-01 MED ORDER — OXYCODONE HCL 5 MG PO TABS: 5.0000 mg | ORAL_TABLET | Freq: Once | ORAL | Status: DC | PRN

## 2021-04-01 MED ORDER — TRAMADOL HCL 50 MG PO TABS
50.0000 mg | ORAL_TABLET | Freq: Four times a day (QID) | ORAL | 0 refills | Status: AC | PRN
Start: 2021-04-01 — End: 2021-04-06

## 2021-04-01 MED ORDER — MIDAZOLAM HCL 2 MG/2ML IJ SOLN
INTRAMUSCULAR | Status: DC | PRN
  Administered 2021-04-01: 2 mg via INTRAVENOUS

## 2021-04-01 SURGICAL SUPPLY — 37 items
APL SKNCLS STERI-STRIP NONHPOA (GAUZE/BANDAGES/DRESSINGS) ×1
BENZOIN TINCTURE PRP APPL 2/3 (GAUZE/BANDAGES/DRESSINGS) ×2 IMPLANT
BLADE SURG 15 STRL LF DISP TIS (BLADE) ×1 IMPLANT
BLADE SURG 15 STRL SS (BLADE) ×2
CNTNR URN SCR LID CUP LEK RST (MISCELLANEOUS) ×1 IMPLANT
CONT SPEC 4OZ STRL OR WHT (MISCELLANEOUS) ×2
COVER BACK TABLE 60X90IN (DRAPES) ×2 IMPLANT
COVER MAYO STAND STRL (DRAPES) ×2 IMPLANT
DECANTER SPIKE VIAL GLASS SM (MISCELLANEOUS) ×2 IMPLANT
DRAPE LAPAROTOMY 100X72 PEDS (DRAPES) ×2 IMPLANT
DRSG PAD ABDOMINAL 8X10 ST (GAUZE/BANDAGES/DRESSINGS) ×2 IMPLANT
ELECT REM PT RETURN 15FT ADLT (MISCELLANEOUS) ×2 IMPLANT
GAUZE 4X4 16PLY ~~LOC~~+RFID DBL (SPONGE) ×2 IMPLANT
GAUZE SPONGE 4X4 12PLY STRL (GAUZE/BANDAGES/DRESSINGS) ×2 IMPLANT
GAUZE SPONGE 4X4 12PLY STRL LF (GAUZE/BANDAGES/DRESSINGS) ×2 IMPLANT
GLOVE SRG 8 PF TXTR STRL LF DI (GLOVE) ×1 IMPLANT
GLOVE SURG ENC MOIS LTX SZ7.5 (GLOVE) ×2 IMPLANT
GLOVE SURG UNDER POLY LF SZ8 (GLOVE) ×2
GOWN STRL REUS W/TWL XL LVL3 (GOWN DISPOSABLE) ×4 IMPLANT
KIT TURNOVER CYSTO (KITS) ×2 IMPLANT
LOOP VESSEL MAXI BLUE (MISCELLANEOUS) IMPLANT
NEEDLE HYPO 22GX1.5 SAFETY (NEEDLE) ×2 IMPLANT
PACK BASIN DAY SURGERY FS (CUSTOM PROCEDURE TRAY) ×2 IMPLANT
PANTS MESH DISP LRG (UNDERPADS AND DIAPERS) ×1 IMPLANT
PANTS MESH DISPOSABLE L (UNDERPADS AND DIAPERS) ×1
PENCIL SMOKE EVACUATOR (MISCELLANEOUS) ×2 IMPLANT
SHEARS HARMONIC 9CM CVD (BLADE) IMPLANT
SURGILUBE 2OZ TUBE FLIPTOP (MISCELLANEOUS) ×2 IMPLANT
SUT CHROMIC 2 0 SH (SUTURE) ×2 IMPLANT
SUT CHROMIC 3 0 SH 27 (SUTURE) IMPLANT
SUT VIC AB 2-0 SH 27 (SUTURE)
SUT VIC AB 2-0 SH 27X BRD (SUTURE) IMPLANT
SUT VIC AB 2-0 UR6 27 (SUTURE) ×12 IMPLANT
SYR 20ML LL LF (SYRINGE) ×2 IMPLANT
SYR 3ML LL SCALE MARK (SYRINGE) IMPLANT
SYR CONTROL 10ML LL (SYRINGE) ×2 IMPLANT
TOWEL OR 17X26 10 PK STRL BLUE (TOWEL DISPOSABLE) ×2 IMPLANT

## 2021-04-01 NOTE — Discharge Instructions (Addendum)
ANORECTAL SURGERY: POST OP INSTRUCTIONS  DIET: Follow a light bland diet the first 24 hours after arrival home, such as soup, liquids, crackers, etc.  Be sure to include lots of fluids daily.  Avoid fast food or heavy meals as your are more likely to get nauseated.  Eat a low fat diet the next few days after surgery.   Some bleeding with bowel movements is expected for the first couple of days but this should stop in between bowel movements  Take your usually prescribed home medications unless otherwise directed.  PAIN CONTROL: It is helpful to take an over-the-counter pain medication regularly for the first few days/weeks.  Choose from the following that works best for you: Ibuprofen (Advil, etc) Three 200mg tabs every 6 hours as needed. Acetaminophen (Tylenol, etc) 500-650mg every 6 hours as needed NOTE: You may take both of these medications together - most patients find it most helpful when alternating between the two (i.e. Ibuprofen at 6am, tylenol at 9am, ibuprofen at 12pm ...) A  prescription for pain medication may have been prescribed for you at discharge.  Take your pain medication as prescribed.  If you are having problems/concerns with the prescription medicine, please call us for further advice.  Avoid getting constipated.  Between the surgery and the pain medications, it is common to experience some constipation.  Increasing fluid intake (64oz of water per day) and taking a fiber supplement (such as Metamucil, Citrucel, FiberCon) 1-2 times a day regularly will usually help prevent this problem from occurring.  Take Miralax (over the counter) 1-2x/day while taking a narcotic pain medication. If no bowel movement after 48hours, you may additionally take a laxative like a bottle of Milk of Magnesia which can be purchased over the counter. Avoid enemas if possible as these are often painful.   Watch out for diarrhea.  If you have many loose bowel movements, simplify your diet to bland  foods.  Stop any stool softeners and decrease your fiber supplement. If this worsens or does not improve, please call us.  Wash / shower every day.  If you were discharged with a dressing, you may remove this the day after your surgery. You may shower normally, getting soap/water on your wound, particularly after bowel movements.  Soaking in a warm bath filled a couple inches ("Sitz bath") is a great way to clean the area after a bowel movement and many patients find it is a way to soothe the area.  ACTIVITIES as tolerated:   You may resume regular (light) daily activities beginning the next day--such as daily self-care, walking, climbing stairs--gradually increasing activities as tolerated.  If you can walk 30 minutes without difficulty, it is safe to try more intense activity such as jogging, treadmill, bicycling, low-impact aerobics, etc. Refrain from any heavy lifting or straining for the first 2 weeks after your procedure, particularly if your surgery was for hemorrhoids. Avoid activities that make your pain worse You may drive when you are no longer taking prescription pain medication, you can comfortably wear a seatbelt, and you can safely maneuver your car and apply brakes.  FOLLOW UP in our office Please call [facility] at [phone] to set up an appointment to see your surgeon in the office for a follow-up appointment approximately 2 weeks after your surgery. Make sure that you call for this appointment the day you arrive home to insure a convenient appointment time.  9. If you have disability or family leave forms that need to be completed,   you may have them completed by your primary care physician's office; for return to work instructions, please ask our office staff and they will be happy to assist you in obtaining this documentation   When to call us [phone]: Poor pain control Reactions / problems with new medications (rash/itching, etc)  Fever over 101.5 F (38.5 C) Inability  to urinate Nausea/vomiting Worsening swelling or bruising Continued bleeding from incision. Increased pain, redness, or drainage from the incision  The clinic staff is available to answer your questions during regular business hours (8:30am-5pm).  Please don't hesitate to call and ask to speak to one of our nurses for clinical concerns.   A surgeon from [facility] is always on call at the hospitals   If you have a medical emergency, go to the nearest emergency room or call 911.   [facility], [address], [address], [place], [place]  [address] ? MAIN: [phone] FAX [phone] [email or web address]  Information for Discharge Teaching: EXPAREL (bupivacaine liposome injectable suspension)   Your surgeon or anesthesiologist gave you EXPAREL(bupivacaine) to help control your pain after surgery.  EXPAREL is a local anesthetic that provides pain relief by numbing the tissue around the surgical site. EXPAREL is designed to release pain medication over time and can control pain for up to 72 hours. Depending on how you respond to EXPAREL, you may require less pain medication during your recovery.  Possible side effects: Temporary loss of sensation or ability to move in the area where bupivacaine was injected. Nausea, vomiting, constipation Rarely, numbness and tingling in your mouth or lips, lightheadedness, or anxiety may occur. Call your doctor right away if you think you may be experiencing any of these sensations, or if you have other questions regarding possible side effects.  Follow all other discharge instructions given to you by your surgeon or nurse. Eat a healthy diet and drink plenty of water or other fluids.  If you return to the hospital for any reason within 96 hours following the administration of EXPAREL, it is important for health care providers to know that you have received this anesthetic. A teal colored band has been  placed on your arm with the date, time and amount of EXPAREL you have received in order to alert and inform your health care providers. Please leave this armband in place for the full 96 hours following administration, and then you may remove the band.   Post Anesthesia Home Care Instructions  Activity: Get plenty of rest for the remainder of the day. A responsible individual must stay with you for 24 hours following the procedure.  For the next 24 hours, DO NOT: -Drive a car -Paediatric nurse -Drink alcoholic beverages -Take any medication unless instructed by your physician -Make any legal decisions or sign important papers.  Meals: Start with liquid foods such as gelatin or soup. Progress to regular foods as tolerated. Avoid greasy, spicy, heavy foods. If nausea and/or vomiting occur, drink only clear liquids until the nausea and/or vomiting subsides. Call your physician if vomiting continues.  Special Instructions/Symptoms: Your throat may feel dry or sore from the anesthesia or the breathing tube placed in your throat during surgery. If this causes discomfort, gargle with warm salt water. The discomfort should disappear within 24 hours.

## 2021-04-01 NOTE — H&P (Signed)
CC: Referred by Dr. Rush Landmark for newly diagnosed anal canal lesion - here today for surgery   HPI: Mr. Sheeley is a very pleasant 40yoM with no prior medical history presents to our office for evaluation of an anal canal polypoid lesion found during colonoscopy.  He reports that approximately 2 years ago he had a laparoscopic cholecystectomy performed in [place] area and since that time he had had some vague left-sided abdominal discomforts and pains.  He also had associated diarrhea that has been somewhat chronic now.  He underwent upper and lower endoscopy with Dr. Rush Landmark.  He has been started on a PPI for some esophagitis.  His diarrhea has somewhat improved.  Incidentally, he was found to have a small nodule in the anal canal.  As is biopsied and returned as a potential squamous papilloma.  He was referred to our office.  He denies any complaints today.  He denies any history of anorectal pain or bleeding.  He denies any history of prolapse, hemorrhoids, or anal fissures.  He denies any changes in his health or health hx since we met in the office. No new medications/allergies/health issues.   PMH: Denies   PSH: Laparoscopic cholecystectomy in 2020, [place].   FHx: Denies FHx of colorectal, breast, endometrial, ovarian or cervical cancer   Social: Denies use of tobacco/EtOH/drugs. He is self employed as a Cabin crew   ROS: A comprehensive 10 system review of systems was completed with the patient and pertinent findings as noted above.    Past Medical History:  Diagnosis Date   Eosinophilic esophagitis    Gastritis determined by endoscopy 2022   mild, negative H. Pylori   H/O spleen injury 2005   sports injury- "spleen leakage"   Hiatal hernia    Squamous papilloma 2022   Polypoid lesion at the dentate line> bx> Sq. pap.    Wears contact lenses     Past Surgical History:  Procedure Laterality Date   COLONOSCOPY WITH ESOPHAGOGASTRODUODENOSCOPY (EGD)  11/17/2020   mild  gastritis. H. pylori negative./ rectal polyp>sq. papilloma.    ESOPHAGOGASTRODUODENOSCOPY  03/18/2021   INGUINAL HERNIA REPAIR Bilateral    age 21-7 yrs old.   LAPAROSCOPIC CHOLECYSTECTOMY  06/21/2019   in [place]    Family History  Problem Relation Age of Onset   Hypertension Mother    Birth defects Sister    Heart attack Maternal Grandmother    Heart attack Maternal Grandfather    Colon polyps Neg Hx    Colon cancer Neg Hx    Esophageal cancer Neg Hx    Inflammatory bowel disease Neg Hx    Liver disease Neg Hx    Pancreatic cancer Neg Hx    Stomach cancer Neg Hx    Rectal cancer Neg Hx     Social:  reports that he has never smoked. He has never used smokeless tobacco. He reports that he does not drink alcohol and does not use drugs.  Allergies:  Allergies  Allergen Reactions   Banana Diarrhea and Nausea And Vomiting    Medications: I have reviewed the patient's current medications.  No results found for this or any previous visit (from the past 48 hour(s)).  No results found.  ROS - all of the below systems have been reviewed with the patient and positives are indicated with bold text General: chills, fever or night sweats Eyes: blurry vision or double vision ENT: epistaxis or sore throat Allergy/Immunology: itchy/watery eyes or nasal congestion Hematologic/Lymphatic:  bleeding problems, blood clots or swollen lymph nodes Endocrine: temperature intolerance or unexpected weight changes Breast: new or changing breast lumps or nipple discharge Resp: cough, shortness of breath, or wheezing CV: chest pain or dyspnea on exertion GI: as per HPI GU: dysuria, trouble voiding, or hematuria MSK: joint pain or joint stiffness Neuro: TIA or stroke symptoms Derm: pruritus and skin lesion changes Psych: anxiety and depression  PE Blood pressure 127/85, pulse 65, temperature 98.1 F (36.7 C), temperature source Oral, resp. rate 16, height _0  (1.727 m), weight 92.7 kg,  SpO2 99 %. Constitutional: NAD; conversant Eyes: Moist conjunctiva; no lid lag; anicteric Lungs: Normal respiratory effort CV: RRR MSK: Normal range of motion of extremities Psychiatric: Appropriate affect; alert and oriented x3   No results found for this or any previous visit (from the past 48 hour(s)).  No results found.   A/P: Mr. 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 Seeing Dr. Chryl Heck. She was asking radiology about other tests if necessary; it's likely with his history this is perfusion abnormalities related to his stated splenic injury in the past.   -The anatomy and physiology of the anal canal was discussed at length with the patient. The pathophysiology of anal canal papillomas as well as other potential things such as condyloma or AIN were reviewed with associated pictures and illustrations. -We have reviewed observation vs excision - with excision providing pathologic evaluation to ensure this is just a 'squamous papilloma' which are rare things to find in the anal canal. He would prefer removal which I agree would help provide further clarification on the findings -The planned procedure, material risks (including, but not limited to, pain, bleeding, infection, scarring, need for additional procedures, recurrence, pneumonia, heart attack, stroke, death) benefits and alternatives to surgery were discussed at length. The patient's questions were answered to his satisfaction, he voiced understanding and elected to proceed with surgery. Additionally, we discussed typical postoperative expectations and the recovery process.  Nadeen Landau, MD [facility] Use [email or web address] to contact on call provider

## 2021-04-01 NOTE — Anesthesia Postprocedure Evaluation (Signed)
Anesthesia Post Note  Patient: Adam Dean  Procedure(s) Performed: TRANSANAL EXCISION OF ANAL CANAL LESION (Anus)     Patient location during evaluation: PACU Anesthesia Type: General Level of consciousness: awake and alert Pain management: pain level controlled Vital Signs Assessment: post-procedure vital signs reviewed and stable Respiratory status: spontaneous breathing, nonlabored ventilation, respiratory function stable and patient connected to nasal cannula oxygen Cardiovascular status: blood pressure returned to baseline and stable Postop Assessment: no apparent nausea or vomiting Anesthetic complications: no   No notable events documented.  Last Vitals:  Vitals:   04/01/21 0845 04/01/21 1009  BP: 121/75 126/89  Pulse: 76 (!) 50  Resp: 15 16  Temp:  (!) 36.4 C  SpO2: 96% 99%    Last Pain:  Vitals:   04/01/21 1009  TempSrc: Oral  PainSc: 0-No pain                 Belenda Cruise P Adeliz Tonkinson

## 2021-04-01 NOTE — Op Note (Signed)
04/01/2021  8:30 AM  PATIENT:  Adam Dean  40 y.o. male  Patient Care Team: Ma Hillock, DO as PCP - General (Family Medicine)  PRE-OPERATIVE DIAGNOSIS:  Anorectal polypoid lesion  POST-OPERATIVE DIAGNOSIS:  Same  PROCEDURE:   Transanal excision of anorectal polypoid lesion Anorectal exam under anesthesia  SURGEON:  Surgeon(s): Ileana Roup, MD   ANESTHESIA:   local and general  SPECIMEN:  Anal canal polypoid lesion - right anterior  DISPOSITION OF SPECIMEN:  PATHOLOGY  COUNTS:  Sponge, needle, and instrument counts were reported correct x2 at conclusion.  EBL: 5 mL  Drains: None  PLAN OF CARE: Discharge to home after PACU  PATIENT DISPOSITION:  PACU - hemodynamically stable.  OR FINDINGS: Small decompressed external hemorrhoids. Right anterior anal canal polypoid lesion which is soft and mobile 5 x 5 mm in size. This was excised and submitted for pathology.  DESCRIPTION: The patient was identified in the preoperative holding area and taken to the OR. SCDs were applied. He then underwent general endotracheal anesthesia without difficulty. The patient was then rolled onto the OR table in the prone jackknife position. Pressure points were then evaluated and padded. Benzoin was applied to the buttocks and they were gently taped apart.  He was then prepped and draped in usual sterile fashion.  A surgical timeout was performed indicating the correct patient, procedure, and positioning.  A perianal block was then created using a dilute mixture of 0.25% Marcaine with epinephrine and Exparel.  After ascertaining an appropriate level of anesthesia had been achieved, a well lubricated digital rectal exam was performed. This demonstrated no palpable masses. Externally, small 3 column decompressed external hemorrhoids  A Hill-Ferguson anoscope was into the anal canal and circumferential inspection demonstrated normal appearing anoderm. There is an apparent polypoid lesion in  the right anterior anal canal at dentate consistent with endoscopic findings with Dr. Rush Landmark.  This is soft, mobile. This is elevated and excised sharply. This is passed off as specimen. Hemostasis is achieved with electrocautery. The excision site is then closed with a 3-0 Chromic stitch. Additional anesthetic is infiltrated. Topical dibucaine is applied. Sponge, needle and instrument counts are reported correct x 2. Dressing consisting of 4x4s, ABD, mesh underwear is placed. He is then rolled onto a stretcher, awakened from anesthesia, extubated and transported to PACU in satisfactory condition.   DISPOSITION: PACU in satisfactory condition.

## 2021-04-01 NOTE — Transfer of Care (Signed)
Immediate Anesthesia Transfer of Care Note  Patient: Adam Dean  Procedure(s) Performed: TRANSANAL EXCISION OF ANAL CANAL LESION (Anus)  Patient Location: PACU  Anesthesia Type:General  Level of Consciousness: awake, drowsy and responds to stimulation  Airway & Oxygen Therapy: Patient Spontanous Breathing and Patient connected to nasal cannula oxygen  Post-op Assessment: Report given to RN and Post -op Vital signs reviewed and stable  Post vital signs: Reviewed and stable  Last Vitals:  Vitals Value Taken Time  BP    Temp    Pulse 92 04/01/21 0819  Resp 11 04/01/21 0819  SpO2 98 % 04/01/21 0819  Vitals shown include unvalidated device data.  Last Pain:  Vitals:   04/01/21 0615  TempSrc: Oral  PainSc: 0-No pain      Patients Stated Pain Goal: 7 ([id number])  Complications: No notable events documented.

## 2021-04-01 NOTE — Anesthesia Preprocedure Evaluation (Signed)
Anesthesia Evaluation  Patient identified by MRN, date of birth, ID band Patient awake    Reviewed: Allergy & Precautions, NPO status , Patient's Chart, lab work & pertinent test results  Airway Mallampati: II  TM Distance: >3 FB Neck ROM: Full    Dental  (+) Teeth Intact   Pulmonary neg pulmonary ROS,    Pulmonary exam normal        Cardiovascular negative cardio ROS   Rhythm:Regular Rate:Normal     Neuro/Psych negative neurological ROS  negative psych ROS   GI/Hepatic Neg liver ROS, hiatal hernia, GERD  Medicated,Anal lesion   Endo/Other  negative endocrine ROS  Renal/GU negative Renal ROS  negative genitourinary   Musculoskeletal negative musculoskeletal ROS (+)   Abdominal (+)  Abdomen: soft. Bowel sounds: normal.  Peds  Hematology negative hematology ROS (+)   Anesthesia Other Findings   Reproductive/Obstetrics                             Anesthesia Physical Anesthesia Plan  ASA: 2  Anesthesia Plan: General   Post-op Pain Management:    Induction: Intravenous  PONV Risk Score and Plan: 2 and Ondansetron, Dexamethasone, Midazolam and Treatment may vary due to age or medical condition  Airway Management Planned: Mask and Oral ETT  Additional Equipment: None  Intra-op Plan:   Post-operative Plan: Extubation in OR  Informed Consent: I have reviewed the patients History and Physical, chart, labs and discussed the procedure including the risks, benefits and alternatives for the proposed anesthesia with the patient or authorized representative who has indicated his/her understanding and acceptance.     Dental advisory given  Plan Discussed with: CRNA  Anesthesia Plan Comments:         Anesthesia Quick Evaluation

## 2021-04-01 NOTE — Progress Notes (Signed)
Reported patient's itching on chest and thighs to Dr. Gloris Manchester and that it normally happens when patient is cold.  It feels the same as it usually does. Instructed to use benadyl cream as needed. Also instructed patient to call MD if it gets worse and cream doesn't help. Patient understands and feels comfortable to go home.

## 2021-04-01 NOTE — Anesthesia Procedure Notes (Signed)
Procedure Name: Intubation Date/Time: 04/01/2021 7:38 AM Performed by: Lollie Sails, CRNA Pre-anesthesia Checklist: Patient identified, Emergency Drugs available, Suction available, Patient being monitored and Timeout performed Patient Re-evaluated:Patient Re-evaluated prior to induction Oxygen Delivery Method: Circle system utilized Preoxygenation: Pre-oxygenation with 100% oxygen Induction Type: IV induction Ventilation: Mask ventilation without difficulty Laryngoscope Size: Miller and 3 Grade View: Grade I Tube type: Oral Tube size: 7.5 mm Number of attempts: 1 Airway Equipment and Method: Stylet Placement Confirmation: ETT inserted through vocal cords under direct vision, positive ETCO2 and breath sounds checked- equal and bilateral Secured at: 23 cm Tube secured with: Tape Dental Injury: Teeth and Oropharynx as per pre-operative assessment

## 2021-04-04 ENCOUNTER — Encounter (HOSPITAL_BASED_OUTPATIENT_CLINIC_OR_DEPARTMENT_OTHER): Payer: Self-pay | Admitting: Surgery

## 2021-04-04 LAB — SURGICAL PATHOLOGY

## 2021-05-30 ENCOUNTER — Telehealth: Payer: Self-pay | Admitting: Hematology and Oncology

## 2021-05-30 NOTE — Telephone Encounter (Signed)
Rescheduled 11/10 appointment to 11/15 due to provider pal, patient has been called and voicemail was left.

## 2021-06-16 ENCOUNTER — Ambulatory Visit: Payer: BC Managed Care – PPO | Admitting: Hematology and Oncology

## 2021-06-16 ENCOUNTER — Telehealth: Payer: Self-pay | Admitting: Physician Assistant

## 2021-06-16 NOTE — Telephone Encounter (Signed)
This patient is scheduled to see me next week on 06/21/21 per Dr. Rob Hickman 5/11 LOS. Per chart review, Dr. Rob Hickman last note from 5/11 states : "Spoke to Dr Candise Che from radiology who reviewed imaging and said the findings are artifactual, he doesn't think there is any splenic abnormality and this doesn't need follow up, this is likely from the technique of imaging. He can cancel his FU with [place].". It appears that his follow up was never cancelled. I called the patient and left a voicemail letting him know he does not need to come into the clinic on 06/21/21. I will cancel his appointment. Of course, if the patient feels like he needs to be seen or has a new development, I would be happy to arrange follow up. Left our call back number if needed but I will remove his appointment for 06/21/21.

## 2021-06-21 ENCOUNTER — Ambulatory Visit: Payer: BC Managed Care – PPO | Admitting: Physician Assistant

## 2022-02-20 IMAGING — CT CT ABD-PELV W/ CM
1 of 2 series · 14 of 32 positions shown, 19 images · IV contrast (APPLIED)
Comparison: None.

CLINICAL DATA: Left lower quadrant abdominal pain. Status post
gallbladder and hernia repair.

EXAM:
CT ABDOMEN AND PELVIS WITH CONTRAST
TECHNIQUE: Multidetector CT imaging of the abdomen and pelvis was performed
using the standard protocol following bolus administration of
intravenous contrast.
CONTRAST:  100mL STCP8L-HSS IOPAMIDOL (STCP8L-HSS) INJECTION 61%

[Series 2: abd/pelvis w/cm · axial · 0.71mm/px · z∈[-526,-56]mm · 14 of 106 slices shown, 19 images]
[im 6/106  soft-tissue]
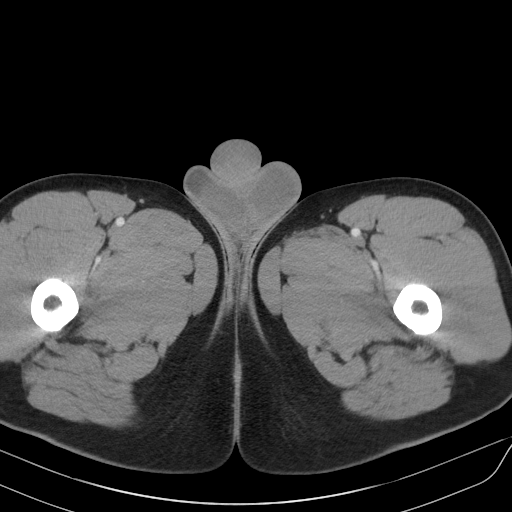
[im 6/106  bone]
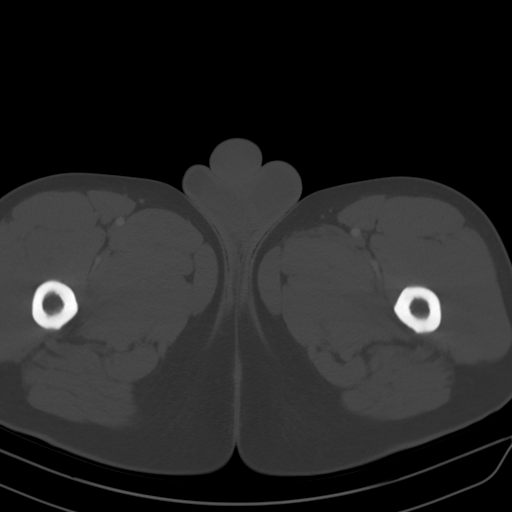
[im 16/106  soft-tissue]
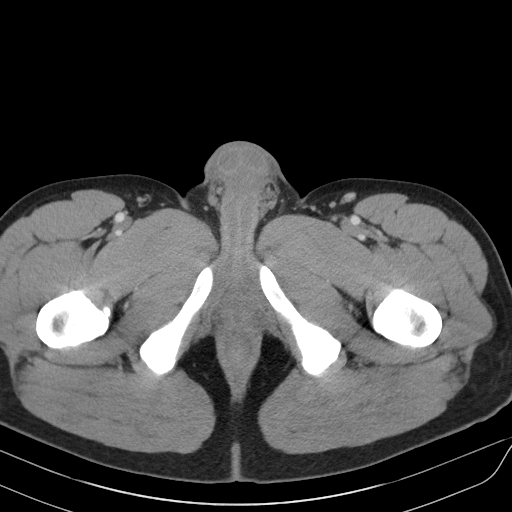
[im 22/106  soft-tissue]
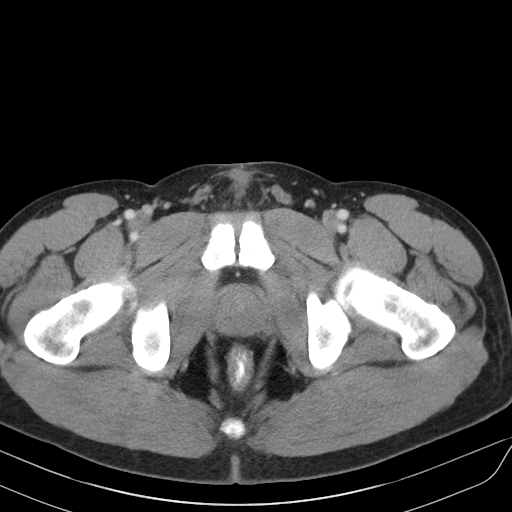
[im 32/106  soft-tissue]
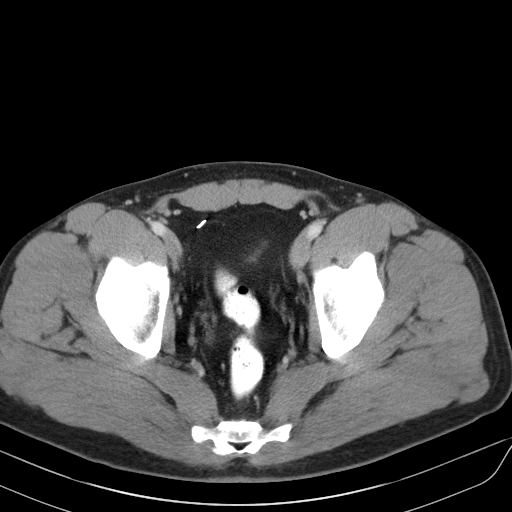
[im 37/106  soft-tissue]
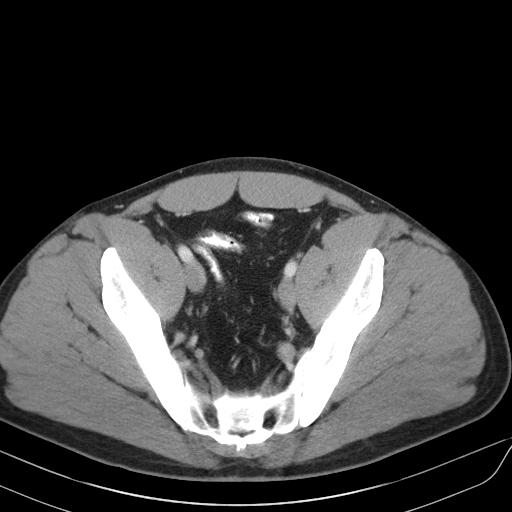
[im 48/106  soft-tissue]
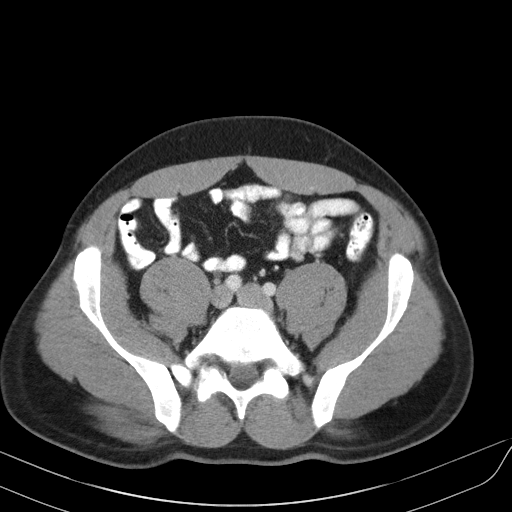
[im 53/106  soft-tissue]
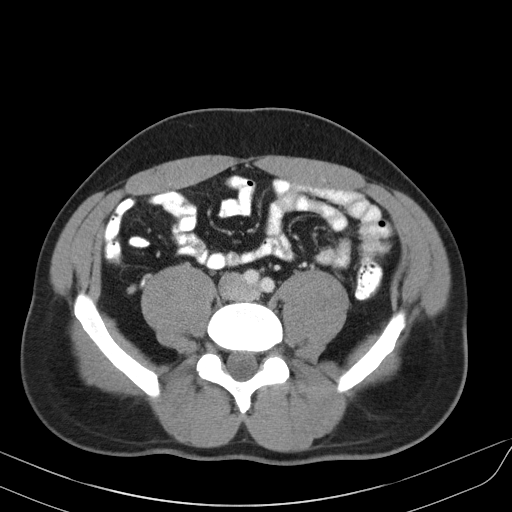
[im 58/106  soft-tissue]
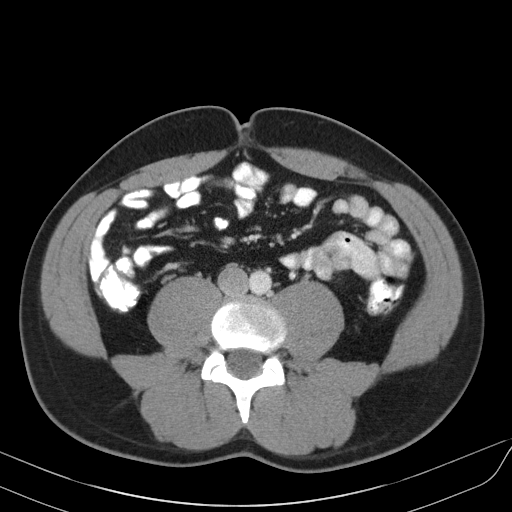
[im 69/106  soft-tissue]
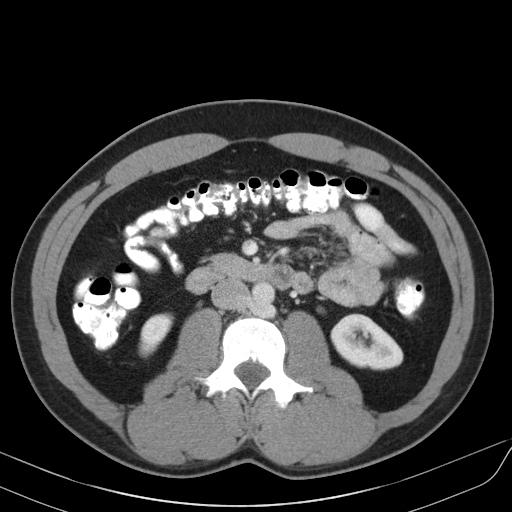
[im 69/106  bone]
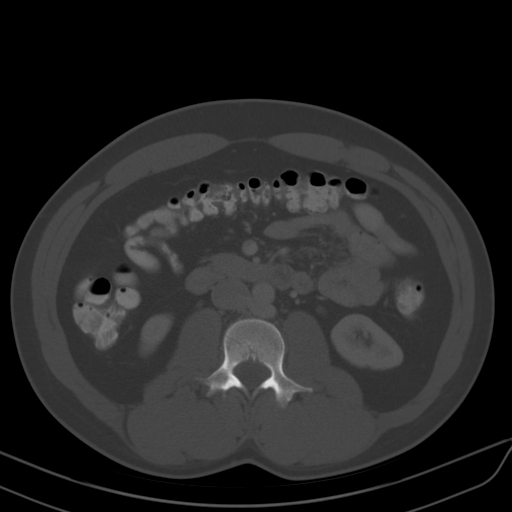
[im 74/106  soft-tissue]
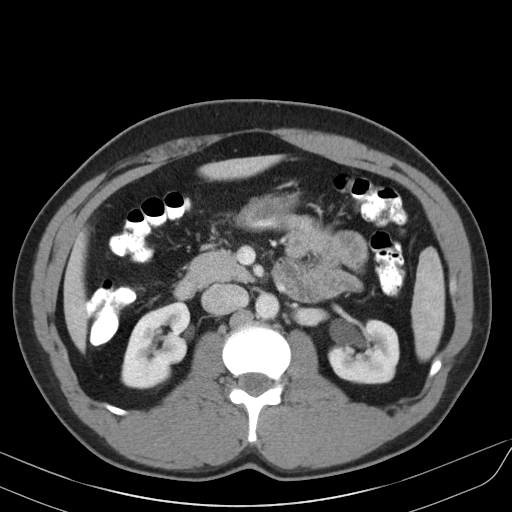
[im 85/106  soft-tissue]
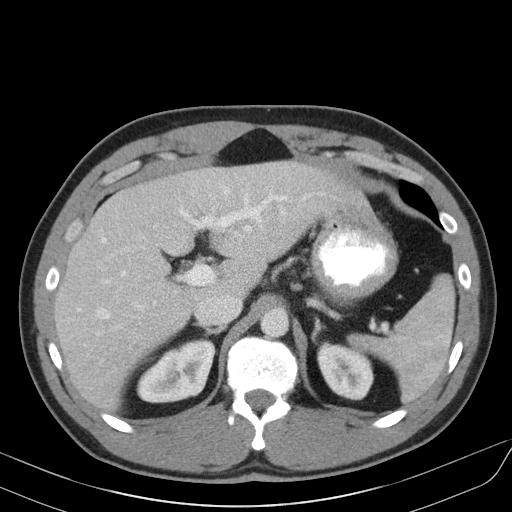
[im 85/106  lung]
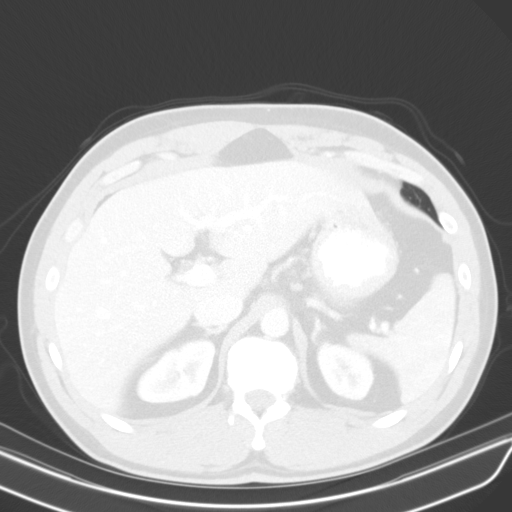
[im 90/106  soft-tissue]
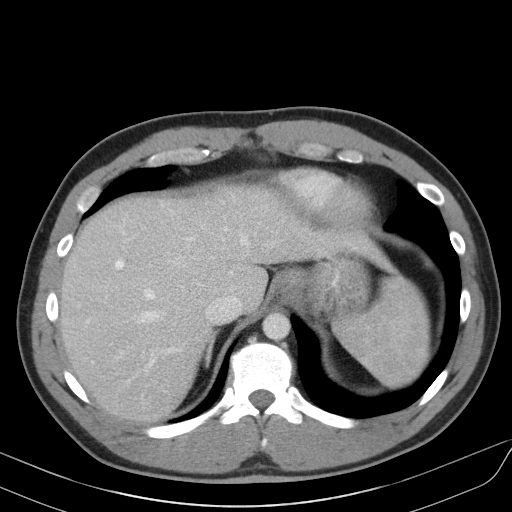
[im 90/106  lung]
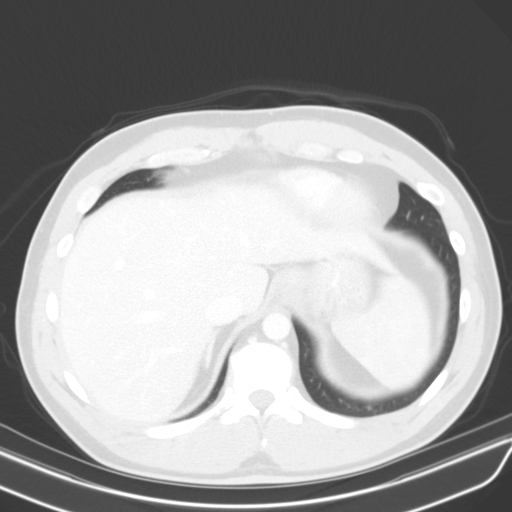
[im 95/106  lung]
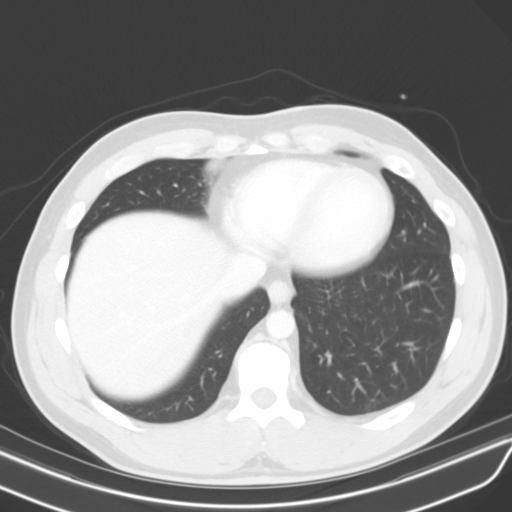
[im 100/106  soft-tissue]
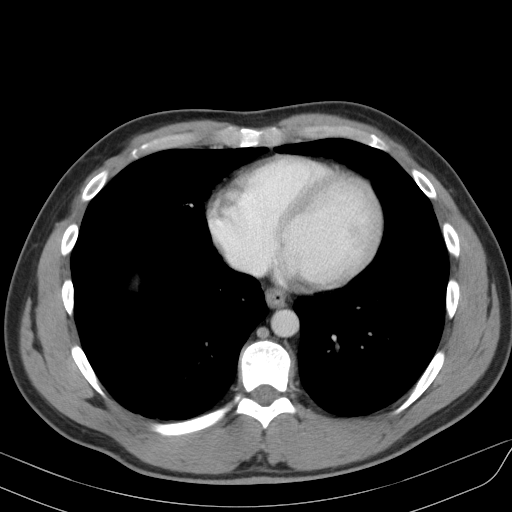
[im 100/106  lung]
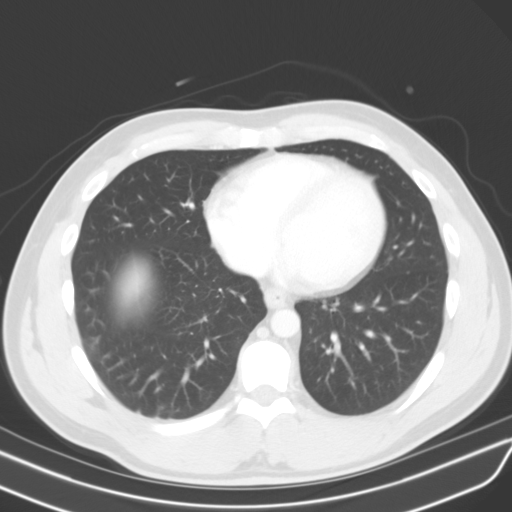

[14 of 32 positions shown; findings below may reference images not displayed]

FINDINGS: Lower chest: No acute abnormality.

Hepatobiliary: No focal liver abnormality. Status post
cholecystectomy. No biliary dilatation.

Pancreas: No focal lesion. Normal pancreatic contour. No surrounding
inflammatory changes. No main pancreatic ductal dilatation.

Spleen: The splenic parenchyma is heterogeneous. Normal in size
without focal abnormality.

Adrenals/Urinary Tract: No adrenal nodule bilaterally. Bilateral
kidneys enhance symmetrically. No hydronephrosis. No hydroureter.
The urinary bladder is unremarkable.

Stomach/Bowel: Stomach is within normal limits. No evidence of bowel
wall thickening or dilatation. Appendix appears normal. PO contrast
reaches the rectum.

Vascular/Lymphatic: No significant vascular findings are present. No
enlarged abdominal or pelvic lymph nodes.

Reproductive: Prostate is unremarkable.

Other: No intraperitoneal free fluid. No intraperitoneal free gas.
No organized fluid collection.

Musculoskeletal: No acute or significant osseous findings.
IMPRESSION: 1. Heterogeneous peripheral splenic parenchyma of unclear etiology.
2. Otherwise no acute intra-abdominal or intrapelvic abnormality.

## 2022-04-24 ENCOUNTER — Encounter: Payer: Self-pay | Admitting: Gastroenterology

## 2022-05-04 ENCOUNTER — Other Ambulatory Visit: Payer: Self-pay

## 2022-05-04 ENCOUNTER — Emergency Department (HOSPITAL_BASED_OUTPATIENT_CLINIC_OR_DEPARTMENT_OTHER)
Admission: EM | Admit: 2022-05-04 | Discharge: 2022-05-04 | Disposition: A | Discharge status: Home / Self Care | Payer: BC Managed Care – PPO | Attending: Emergency Medicine | Admitting: Emergency Medicine

## 2022-05-04 DIAGNOSIS — G510 Bell's palsy: Secondary | ICD-10-CM | POA: Diagnosis not present

## 2022-05-04 DIAGNOSIS — R200 Anesthesia of skin: Secondary | ICD-10-CM | POA: Diagnosis present

## 2022-05-04 MED ORDER — VALACYCLOVIR HCL 1 G PO TABS: 1000.0000 mg | ORAL_TABLET | Freq: Three times a day (TID) | ORAL | 0 refills | Status: AC

## 2022-05-04 MED ORDER — PREDNISONE 20 MG PO TABS: 60.0000 mg | ORAL_TABLET | Freq: Every day | ORAL | 0 refills | Status: AC

## 2022-05-04 NOTE — ED Provider Notes (Signed)
[facility] EMERGENCY DEPARTMENT Provider Note   CSN: [id number] Arrival date & time: 05/04/22  1855     History  Chief Complaint  Patient presents with   facial numbness    Adam Dean is a 41 y.o. male.  Patient here with right-sided facial weakness.  For the last 3 to 4 days.  Recent viral process.  No major medical problems.  Nothing makes it worse or better.  Denies any eye pain.  Denies any extremity weakness or numbness.  No chest pain or shortness of breath or abdominal pain.  The history is provided by the patient.       Home Medications Prior to Admission medications   Medication Sig Start Date End Date Taking? Authorizing Provider  predniSONE (DELTASONE) 20 MG tablet Take 3 tablets (60 mg total) by mouth daily for 7 days. 05/04/22 05/11/22 Yes Emaley Applin, DO  valACYclovir (VALTREX) 1000 MG tablet Take 1 tablet (1,000 mg total) by mouth 3 (three) times daily. 05/04/22  Yes Pollyanna Levay, DO  Multiple Vitamin (MULTIVITAMIN) tablet Take 1 tablet by mouth daily.    [provider]  omeprazole (PRILOSEC) 40 MG capsule Take 1 capsule (40 mg total) by mouth daily. Patient taking differently: Take 40 mg by mouth every evening. 11/17/20   Mansouraty, Telford Nab., MD      Allergies    Banana    Review of Systems   Review of Systems  Physical Exam Updated Vital Signs BP (!) 143/95 (BP Location: Left Arm)   Pulse (!) 56   Temp 98.5 F (36.9 C) (Oral)   Resp 14   Ht '5\' 9"'$  (1.753 m)   Wt 91.2 kg   SpO2 100%   BMI 29.68 kg/m  Physical Exam Vitals and nursing note reviewed.  Constitutional:      General: He is not in acute distress.    Appearance: He is well-developed.  HENT:     Head: Normocephalic and atraumatic.  Eyes:     Conjunctiva/sclera: Conjunctivae normal.  Cardiovascular:     Rate and Rhythm: Normal rate and regular rhythm.     Heart sounds: No murmur heard. Pulmonary:     Effort: Pulmonary effort is normal. No respiratory  distress.     Breath sounds: Normal breath sounds.  Abdominal:     Palpations: Abdomen is soft.     Tenderness: There is no abdominal tenderness.  Musculoskeletal:        General: No swelling.     Cervical back: Neck supple.  Skin:    General: Skin is warm and dry.     Capillary Refill: Capillary refill takes less than 2 seconds.  Neurological:     Mental Status: He is alert and oriented to person, place, and time.     Sensory: No sensory deficit.     Comments: Right-sided facial droop that involves the right side of the forehead, incomplete closure of the right eye, otherwise 5+ out of 5 strength throughout, normal sensation throughout, normal speech  Psychiatric:        Mood and Affect: Mood normal.     ED Results / Procedures / Treatments   Labs (all labs ordered are listed, but only abnormal results are displayed) Labs Reviewed - No data to display  EKG None  Radiology No results found.  Procedures Procedures    Medications Ordered in ED Medications - No data to display  ED Course/ Medical Decision Making/ A&P  Medical Decision Making Risk Prescription drug management.   Adam Dean is here with right-sided facial droop.  Exam appears to be consistent with Bell's palsy.  Normal vitals.  No fever.  He has right-sided facial weakness that includes the forehead.  I have no concern for stroke.  He had recent viral illness which I suspect is the trigger.  We will treat with antiviral, steroids and have him follow-up with ophthalmology, neurology and primary care doctor.  He understands return precautions.  Discharged in good condition.  This chart was dictated using voice recognition software.  Despite best efforts to proofread,  errors can occur which can change the documentation meaning.         Final Clinical Impression(s) / ED Diagnoses Final diagnoses:  Bell's palsy    Rx / DC Orders ED Discharge Orders          Ordered     predniSONE (DELTASONE) 20 MG tablet  Daily        05/04/22 1938    valACYclovir (VALTREX) 1000 MG tablet  3 times daily        05/04/22 1938              Lennice Sites, DO 05/04/22 1940

## 2022-05-04 NOTE — Discharge Instructions (Signed)
Recommend keeping her eye closed at night.  Recommend artificial tears over-the-counter for your eyes to keep them with moisture.  Take medications as prescribed.  Follow-up with neurology, primary care, eye doctor.

## 2022-05-04 NOTE — ED Triage Notes (Signed)
Pt reports right sided facial numbness since Saturday. Right sided facial droop, right eye drainage. No loss of sensation. No other neuro deficits noted.  Reports sick 2 weeks ago with viral infection x 1 week

## 2022-05-05 ENCOUNTER — Telehealth: Payer: Self-pay

## 2022-05-05 NOTE — Telephone Encounter (Signed)
[facility] - Client TELEPHONE ADVICE RECORD [facility] Patient Name: Adam Dean Gender: Male DOB: [birth year] Age: 41 Y 81 M 11 D Return Phone Number: [phone] (Primary) Address: City/ State/ Zip: [place]  [address] Client [facility] - Client Client Site [facility] - Day Provider Raoul Pitch, [place] Contact Type Call Who Is Calling Patient / Member / Family / Caregiver Call Type Triage / Clinical Relationship To Patient Self Return Phone Number [phone] (Primary) Chief Complaint NUMBNESS/TINGLING- sudden on one side of the body or face Reason for Call Symptomatic / Request for Health Information Initial Comment Caller states cold symptoms, lack of movement in face, eyes are watering, possible stroke Translation No Nurse Assessment Nurse: Martyn Ehrich, RN, Adam Dean Date/Time (Eastern Time): 05/04/2022 3:59:50 PM Confirm and document reason for call. If symptomatic, describe symptoms. ---His R eye is leaking and R eye is leaking and cant smile on the R side and it started Sat am when he was brushing his teeth - he could not spit normally. He woke with it. The weekend prior he was sick with HA and fever and they took antiviral meds. He never tested for covid. HA all last week. Foggy now Does the patient have any new or worsening symptoms? ---Yes Will a triage be completed? ---Yes Related visit to physician within the last 2 weeks? ---No Does the PT have any chronic conditions? (i.e. diabetes, asthma, this includes High risk factors for pregnancy, etc.) ---No Is this a behavioral health or substance abuse call? ---No Guidelines Guideline Title Affirmed Question Affirmed Notes Nurse Date/Time (Eastern Time) Neurologic Deficit [1] Weakness (i.e., paralysis, loss of muscle strength) of the face, arm / hand, or leg / foot on one side of the body AND [2] sudden onset AND [3] present  now (Exception: Bell[employer] Gaddy, RN, Adam Dean 6/71/2458 0:99:83 PM PLEASE NOTE: All timestamps contained within this report are represented as [place] Standard Time. CONFIDENTIALTY NOTICE: This fax transmission is intended only for the addressee. It contains information that is legally privileged, confidential or otherwise protected from use or disclosure. If you are not the intended recipient, you are strictly prohibited from reviewing, disclosing, copying using or disseminating any of this information or taking any action in reliance on or regarding this information. If you have received this fax in error, please notify us immediately by telephone so that we can arrange for its return to [place]. Phone: [phone], Toll-Free: [phone], Fax: [phone] Page: 2 of 2 Call Id: [id number] Guidelines Guideline Title Affirmed Question Affirmed Notes Nurse Date/Time Eilene Ghazi Time) palsy suspected [i.e., weakness only on one side of the face, developing over hours to days, no other symptoms].) Disp. Time Eilene Ghazi Time) Disposition Final User 05/04/2022 3:51:03 PM Send to Urgent Bethanie Dicker 05/04/2022 4:02:44 PM Call EMS 911 Now Yes Gaddy, RN, [facility] 9/62/2297 9:89:21 PM 911 Outcome Documentation Gaddy, RN, Felicia Reason: he declined to call 911 - he may drive into ER within the hour Final Disposition 05/04/2022 4:02:44 PM Call EMS 911 Now Yes Gaddy, RN, Adam Dean Caller Disagree/Comply Comply Caller Understands Yes PreDisposition InappropriateToAsk Care Advice Given Per Guideline CALL EMS 911 NOW: * Immediate medical attention is needed. You need to hang up and call 911 (or an ambulance). * Triager Discretion: I'll call you back in a few minutes to be sure you were able to reach them

## 2022-05-05 NOTE — Telephone Encounter (Signed)
Transition Care Management Follow-up Telephone Call Date of discharge and from where: 05/04/22  How have you been since you were released from the hospital? N/A Any questions or concerns? No  Items Reviewed: Did the pt receive and understand the discharge instructions provided? Yes  Medications obtained and verified? Yes  Other? No  Any new allergies since your discharge? No  Dietary orders reviewed? No Do you have support at home? Yes   Home Care and Equipment/Supplies: Were home health services ordered? not applicable If so, what is the name of the agency? N/a  Has the agency set up a time to come to the patient's home? not applicable Were any new equipment or medical supplies ordered?  No What is the name of the medical supply agency? N/a Were you able to get the supplies/equipment? not applicable Do you have any questions related to the use of the equipment or supplies? No  Functional Questionnaire: (I = Independent and D = Dependent) ADLs: I  Bathing/Dressing- I  Meal Prep- I  Eating- I  Maintaining continence- I  Transferring/Ambulation- I  Managing Meds- I  Follow up appointments reviewed:  PCP Hospital f/u appt confirmed?  Pt states he will give it a few weeks and see how medication helps. Will call if wanting to schedule appt   Specialist Hospital f/u appt confirmed? No   Are transportation arrangements needed? No  If their condition worsens, is the pt aware to call PCP or go to the Emergency Dept.? Yes Was the patient provided with contact information for the PCP's office or ED? Yes Was to pt encouraged to call back with questions or concerns? Yes

## 2022-05-05 NOTE — Telephone Encounter (Signed)
Transition Care Management Unsuccessful Follow-up Telephone Call  Date of discharge and from where:  05/04/22 [facility] - Emergency  Attempts:  1st Attempt  Reason for unsuccessful TCM follow-up call:  Left voice message

## 2022-05-23 IMAGING — CT CT ABD-PELV W/ CM
2 of 4 series · 16 of 46 positions shown, 18 images · IV contrast (omnipaque)
Comparison: CT abdomen pelvis 08/26/2020

CLINICAL DATA: Abnormal splenic parenchyma follow-up. Left upper
quadrant pain since gallbladder surgery. Loose more frequent bowel
movement

EXAM:
CT ABDOMEN AND PELVIS WITH CONTRAST
TECHNIQUE: Multidetector CT imaging of the abdomen and pelvis was performed
using the standard protocol following bolus administration of
intravenous contrast.
CONTRAST:  100mL OMNIPAQUE IOHEXOL 300 MG/ML SOLN. PO contrast
administered.

[Series 2: axial st · axial · 0.72mm/px · z∈[-859,-439]mm · 13 of 94 slices shown, 15 images]
[im 5/94  soft-tissue]
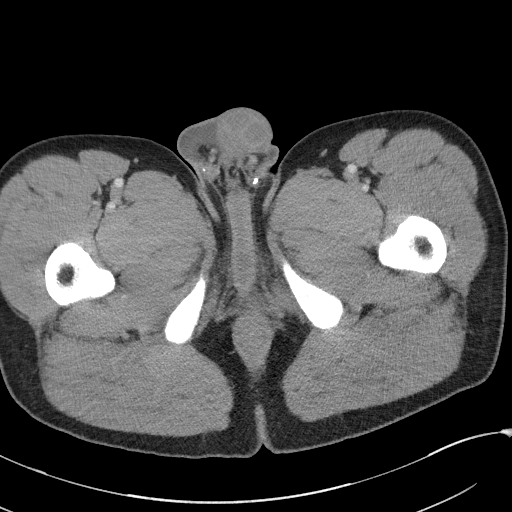
[im 5/94  bone]
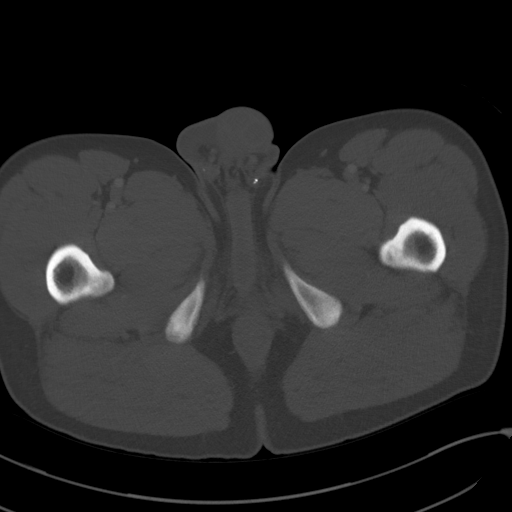
[im 14/94  soft-tissue]
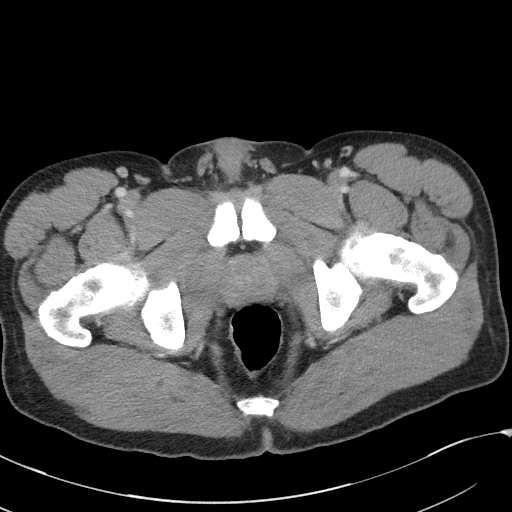
[im 19/94  soft-tissue]
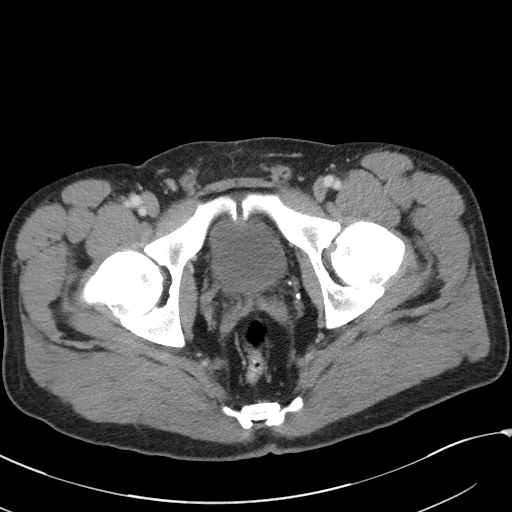
[im 28/94  soft-tissue]
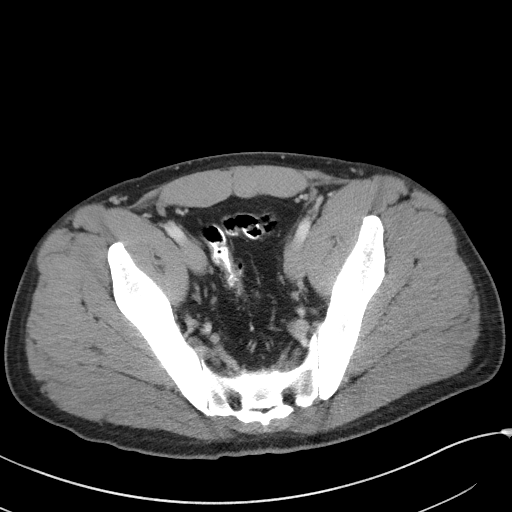
[im 33/94  soft-tissue]
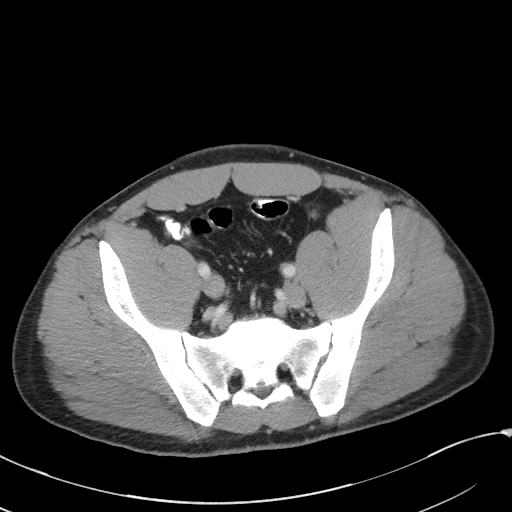
[im 42/94  soft-tissue]
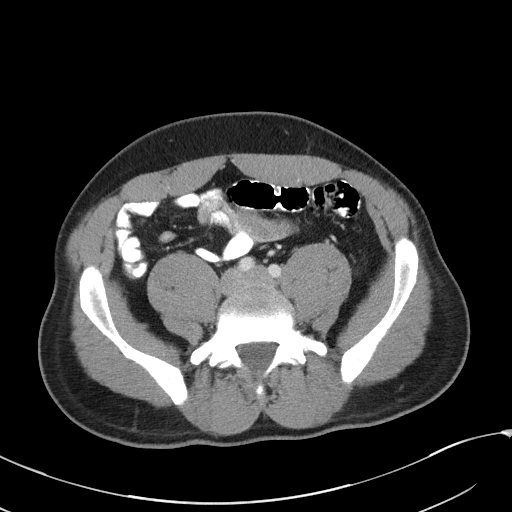
[im 47/94  soft-tissue]
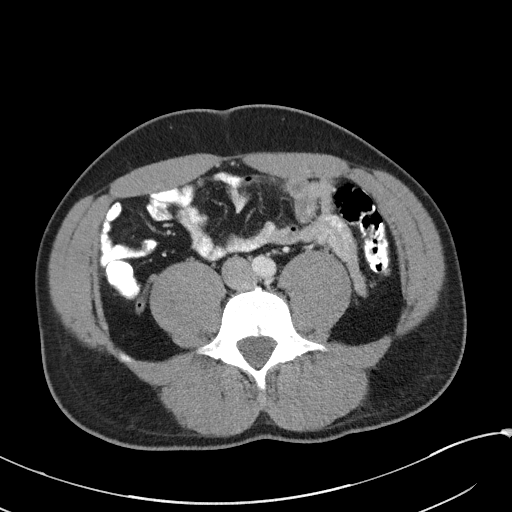
[im 52/94  soft-tissue]
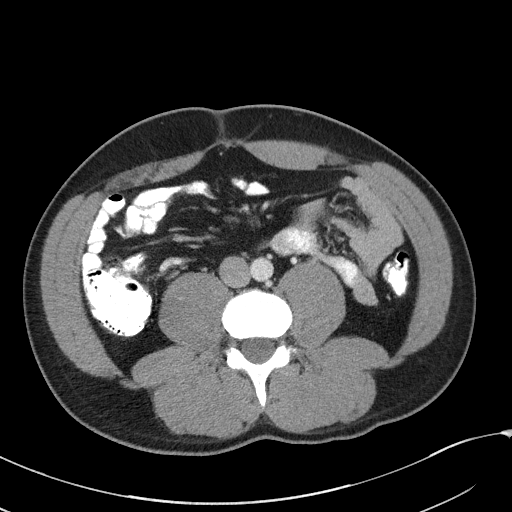
[im 61/94  soft-tissue]
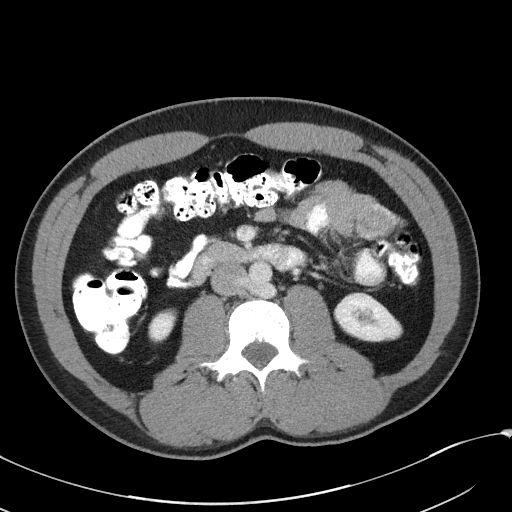
[im 61/94  bone]
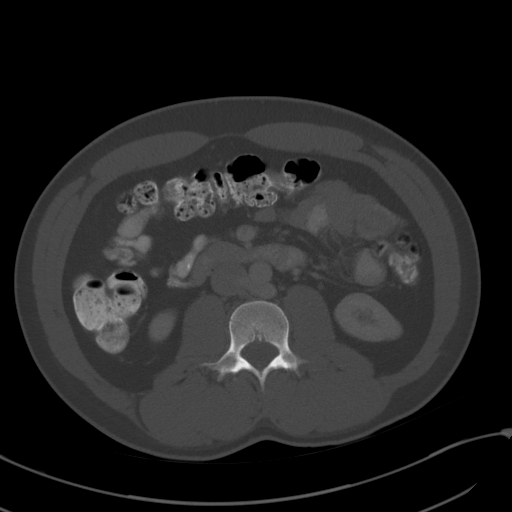
[im 66/94  soft-tissue]
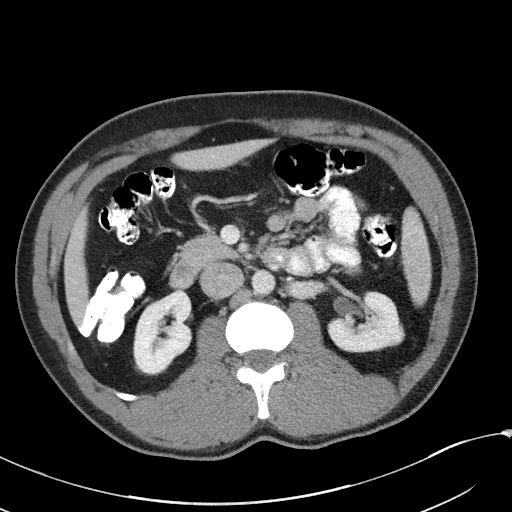
[im 75/94  soft-tissue]
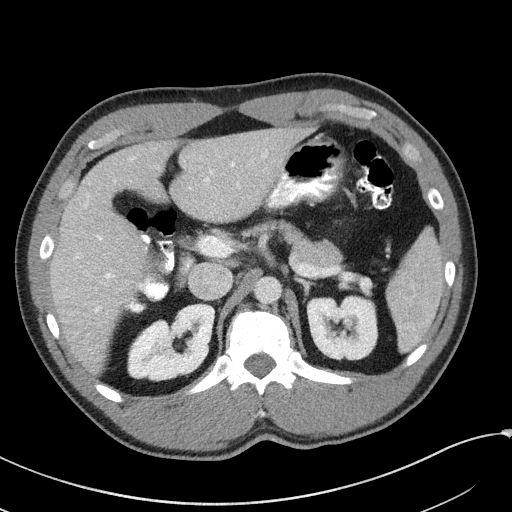
[im 80/94  soft-tissue]
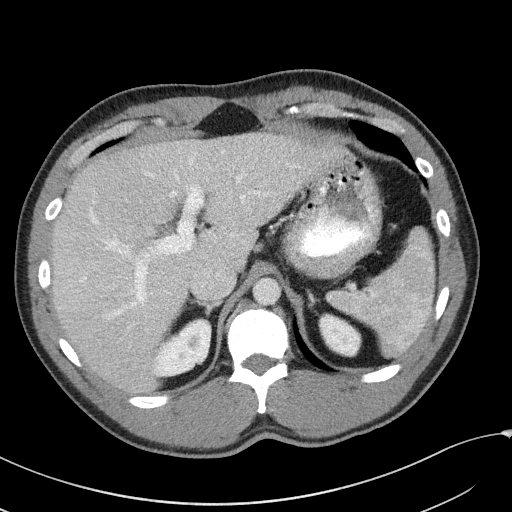
[im 89/94  soft-tissue]
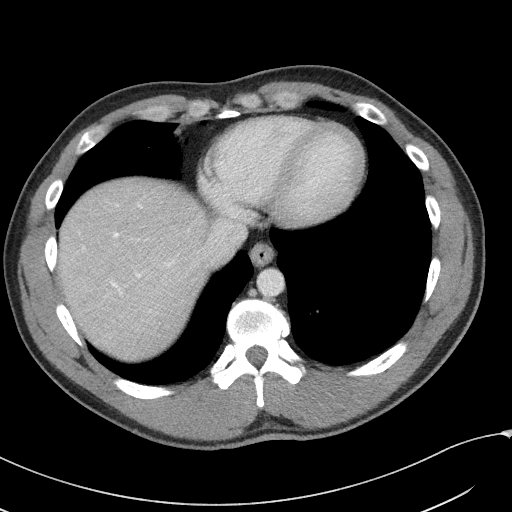

[Series 4: coronal st · coronal · 0.71mm/px · 3 of 91 slices shown]
[im 31/91  soft-tissue]
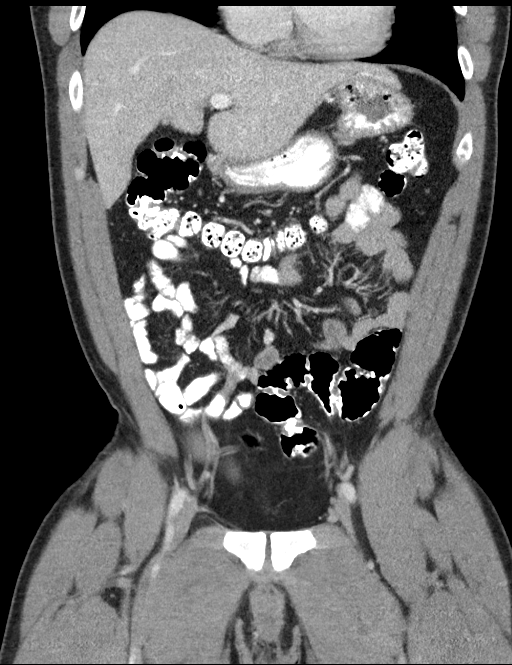
[im 41/91  soft-tissue]
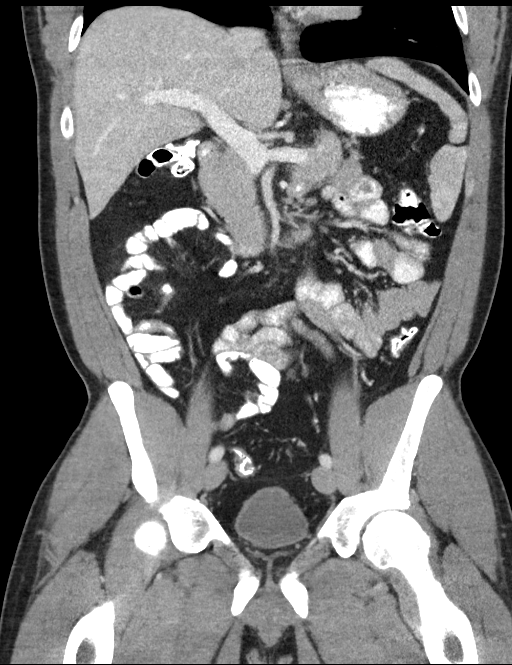
[im 51/91  soft-tissue]
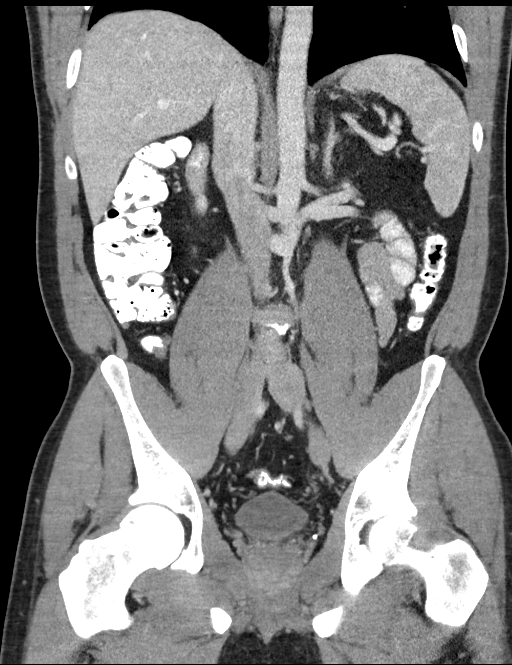

[16 of 46 positions shown; findings below may reference images not displayed]

FINDINGS: Lower chest: No acute abnormality.

Hepatobiliary: No focal liver abnormality. Status post
cholecystectomy. No biliary dilatation.

Pancreas: No focal lesion. Normal pancreatic contour. No surrounding
inflammatory changes. No main pancreatic ductal dilatation.

Spleen: Heterogeneous splenic parenchyma.

Adrenals/Urinary Tract:

No adrenal nodule bilaterally.

Bilateral kidneys enhance symmetrically. No hydronephrosis. No
hydroureter.

The urinary bladder is unremarkable.

Stomach/Bowel: Stomach is within normal limits. No evidence of bowel
wall thickening or dilatation. Appendix appears normal.

Vascular/Lymphatic: The main portal, splenic, proximal superior
mesenteric veins appear patent. No significant vascular findings are
present. No enlarged abdominal or pelvic lymph nodes.

Reproductive: Prostate is unremarkable.

Other: No intraperitoneal free fluid. No intraperitoneal free gas.
No organized fluid collection.

Musculoskeletal: No acute or significant osseous findings.
IMPRESSION: 1. Persistently heterogeneous splenic parenchyma with no associated
splenomegaly. Unclear etiology. Finding could be artifactual versus
represent underlying disease.
2. Otherwise no acute intra-abdominal or intrapelvic abnormality.
